# Patient Record
Sex: Female | Born: 1990 | Race: Black or African American | Hispanic: No | Marital: Married | Smoking: Never smoker
Health system: Southern US, Community
[De-identification: ages and names within clinical notes are randomized; demographics above are authoritative.]

## PROBLEM LIST (undated history)

## (undated) DIAGNOSIS — E669 Obesity, unspecified: Secondary | ICD-10-CM

## (undated) DIAGNOSIS — R51 Headache: Secondary | ICD-10-CM

## (undated) DIAGNOSIS — R519 Headache, unspecified: Secondary | ICD-10-CM

## (undated) DIAGNOSIS — G43909 Migraine, unspecified, not intractable, without status migrainosus: Secondary | ICD-10-CM

## (undated) HISTORY — DX: Headache: R51

## (undated) HISTORY — DX: Obesity, unspecified: E66.9

## (undated) HISTORY — PX: CHOLECYSTECTOMY, LAPAROSCOPIC: SHX56

## (undated) HISTORY — DX: Headache, unspecified: R51.9

## (undated) HISTORY — DX: Migraine, unspecified, not intractable, without status migrainosus: G43.909

## (undated) HISTORY — PX: WISDOM TOOTH EXTRACTION: SHX21

---

## 2013-01-06 ENCOUNTER — Emergency Department (INDEPENDENT_AMBULATORY_CARE_PROVIDER_SITE_OTHER)
Admission: EM | Admit: 2013-01-06 | Discharge: 2013-01-06 | Disposition: A | Discharge status: Home / Self Care | Payer: Worker's Compensation | Source: Home / Self Care | Attending: Family Medicine | Admitting: Family Medicine

## 2013-01-06 ENCOUNTER — Encounter (HOSPITAL_COMMUNITY): Payer: Self-pay | Admitting: Emergency Medicine

## 2013-01-06 DIAGNOSIS — B86 Scabies: Secondary | ICD-10-CM

## 2013-01-06 DIAGNOSIS — Z3490 Encounter for supervision of normal pregnancy, unspecified, unspecified trimester: Secondary | ICD-10-CM

## 2013-01-06 LAB — POCT PREGNANCY, URINE: Preg Test, Ur: POSITIVE — AB

## 2013-01-06 MED ORDER — PERMETHRIN 5 % EX CREA: TOPICAL_CREAM | CUTANEOUS | Status: DC

## 2013-01-06 NOTE — ED Notes (Signed)
C/O sporadic itchy spots that appear as bug bites to entire body.  A couple spots have appeared as vesicles, but pt states that's what her skin does when she gets bug bites.  Family members do not have same rash.  Has tried hydrocortisone cream today.

## 2013-01-06 NOTE — ED Provider Notes (Signed)
Hannah Morton is a 22 y.o. female who presents to Urgent Care today for rash. 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 She notes the rash is quite itchy especially at night. She has not tried any medications.  Additionally she notes irregular menstrual periods her last period was about 6 weeks ago. She is worried that she may be pregnant.   History reviewed. No pertinent past medical history. History  Substance Use Topics  . Smoking status: Never Smoker   . Smokeless tobacco: Not on file  . Alcohol Use: No   ROS as above Medications reviewed. No current facility-administered medications for this encounter.   Current Outpatient Prescriptions  Medication Sig Dispense Refill  . permethrin (ELIMITE) 5 % cream Apply from the neck down at night and wash off in the morning once  180 g  1    Exam:  BP 133/75  Pulse 83  Temp(Src) 98.2 F (36.8 C) (Oral)  Resp 16  SpO2 100%  LMP 11/26/2012 Gen: Well NAD SKIN: Erythematous papules scattered across body and in one interdigital webspace. Most are excoriated.  Results for orders placed during the hospital encounter of 01/06/13 (from the past 24 hour(s))  POCT PREGNANCY, URINE     Status: Abnormal   Collection Time    01/06/13 12:50 PM      Result Value Range   Preg Test, Ur POSITIVE (*) NEGATIVE   No results found.  Assessment and Plan: 22 y.o. female with  1) scabies: Plan to treat with permethrin cream, Benadryl, and Gold Bond Itch. (Permethrin as pregnancy category B.) Followup as needed.  2) pregnancy: Unexpected unplanned pregnancy. Start prenatal vitamins. Refer to [facility]N or [facility].  Discussed warning signs or symptoms. Please see discharge instructions. Patient expresses understanding.     Rodolph Bong,  MD 01/06/13 [phone]

## 2013-02-12 LAB — OB RESULTS CONSOLE RUBELLA ANTIBODY, IGM: RUBELLA: IMMUNE

## 2013-02-12 LAB — OB RESULTS CONSOLE HIV ANTIBODY (ROUTINE TESTING): HIV: NONREACTIVE

## 2013-02-12 LAB — OB RESULTS CONSOLE HEPATITIS B SURFACE ANTIGEN: Hepatitis B Surface Ag: NEGATIVE

## 2013-02-12 LAB — OB RESULTS CONSOLE ABO/RH: RH Type: POSITIVE

## 2013-02-12 LAB — OB RESULTS CONSOLE ANTIBODY SCREEN: ANTIBODY SCREEN: NEGATIVE

## 2013-02-12 LAB — OB RESULTS CONSOLE RPR: RPR: NONREACTIVE

## 2013-03-14 NOTE — L&D Delivery Note (Signed)
Delivery Note At 11:09 AM a viable and healthy female was delivered via Vaginal, Spontaneous Delivery (Presentation: Left Occiput; Anterior ).  APGAR:9 ,9 ; weight pending.   Placenta status: spontaneous, intact.  Cord:  3V   Anesthesia: Epidural  Episiotomy: None Lacerations: None Suture Repair: NA Est. Blood Loss (mL): 250cc  Mom to postpartum.  Baby to Couplet care / Skin to Skin.  ROSS,KENDRA H. 09/04/2013, 11:20 AM

## 2013-08-03 LAB — OB RESULTS CONSOLE GBS: GBS: NEGATIVE

## 2013-08-06 LAB — OB RESULTS CONSOLE GC/CHLAMYDIA
CHLAMYDIA, DNA PROBE: NEGATIVE
Gonorrhea: NEGATIVE

## 2013-09-04 ENCOUNTER — Inpatient Hospital Stay (HOSPITAL_COMMUNITY)
Admission: AD | Admit: 2013-09-04 | Discharge: 2013-09-06 | DRG: 775 | Disposition: A | Discharge status: Home / Self Care | Payer: 59 | Source: Ambulatory Visit | Attending: Obstetrics and Gynecology | Admitting: Obstetrics and Gynecology

## 2013-09-04 ENCOUNTER — Inpatient Hospital Stay (HOSPITAL_COMMUNITY): Payer: 59 | Admitting: Anesthesiology

## 2013-09-04 ENCOUNTER — Encounter (HOSPITAL_COMMUNITY): Payer: Self-pay

## 2013-09-04 ENCOUNTER — Encounter (HOSPITAL_COMMUNITY): Payer: 59 | Admitting: Anesthesiology

## 2013-09-04 DIAGNOSIS — Z885 Allergy status to narcotic agent status: Secondary | ICD-10-CM

## 2013-09-04 DIAGNOSIS — Z87891 Personal history of nicotine dependence: Secondary | ICD-10-CM

## 2013-09-04 DIAGNOSIS — IMO0001 Reserved for inherently not codable concepts without codable children: Secondary | ICD-10-CM

## 2013-09-04 DIAGNOSIS — O4290 Premature rupture of membranes, unspecified as to length of time between rupture and onset of labor, unspecified weeks of gestation: Secondary | ICD-10-CM

## 2013-09-04 DIAGNOSIS — O99891 Other specified diseases and conditions complicating pregnancy: Secondary | ICD-10-CM | POA: Diagnosis present

## 2013-09-04 LAB — CBC
HCT: 35 % — ABNORMAL LOW (ref 36.0–46.0)
HEMOGLOBIN: 11.9 g/dL — AB (ref 12.0–15.0)
MCH: 28 pg (ref 26.0–34.0)
MCHC: 34 g/dL (ref 30.0–36.0)
MCV: 82.4 fL (ref 78.0–100.0)
Platelets: 216 10*3/uL (ref 150–400)
RBC: 4.25 MIL/uL (ref 3.87–5.11)
RDW: 14.2 % (ref 11.5–15.5)
WBC: 12.6 10*3/uL — ABNORMAL HIGH (ref 4.0–10.5)

## 2013-09-04 LAB — ABO/RH: ABO/RH(D): A POS

## 2013-09-04 LAB — TYPE AND SCREEN
ABO/RH(D): A POS
Antibody Screen: NEGATIVE

## 2013-09-04 LAB — RPR

## 2013-09-04 LAB — POCT FERN TEST: POCT Fern Test: POSITIVE

## 2013-09-04 MED ORDER — PRENATAL MULTIVITAMIN CH
1.0000 | ORAL_TABLET | Freq: Every day | ORAL | Status: DC
  Filled 2013-09-04: qty 1

## 2013-09-04 MED ORDER — LIDOCAINE HCL (PF) 1 % IJ SOLN
30.0000 mL | INTRAMUSCULAR | Status: DC | PRN
  Filled 2013-09-04: qty 30

## 2013-09-04 MED ORDER — ONDANSETRON HCL 4 MG/2ML IJ SOLN
4.0000 mg | INTRAMUSCULAR | Status: DC | PRN
Start: 2013-09-04 — End: 2013-09-06

## 2013-09-04 MED ORDER — OXYTOCIN 40 UNITS IN LACTATED RINGERS INFUSION - SIMPLE MED
1.0000 m[IU]/min | INTRAVENOUS | Status: DC
Start: 2013-09-04 — End: 2013-09-04
  Administered 2013-09-04: 2 m[IU]/min via INTRAVENOUS

## 2013-09-04 MED ORDER — OXYTOCIN 40 UNITS IN LACTATED RINGERS INFUSION - SIMPLE MED
62.5000 mL/h | INTRAVENOUS | Status: DC
  Filled 2013-09-04: qty 1000

## 2013-09-04 MED ORDER — PHENYLEPHRINE 40 MCG/ML (10ML) SYRINGE FOR IV PUSH (FOR BLOOD PRESSURE SUPPORT)
80.0000 ug | PREFILLED_SYRINGE | INTRAVENOUS | Status: DC | PRN
  Filled 2013-09-04: qty 10
  Filled 2013-09-04: qty 2

## 2013-09-04 MED ORDER — DIPHENHYDRAMINE HCL 25 MG PO CAPS: 25.0000 mg | ORAL_CAPSULE | Freq: Four times a day (QID) | ORAL | Status: DC | PRN

## 2013-09-04 MED ORDER — LIDOCAINE HCL (PF) 1 % IJ SOLN
INTRAMUSCULAR | Status: DC | PRN
  Administered 2013-09-04 (×4): 4 mL

## 2013-09-04 MED ORDER — IBUPROFEN 600 MG PO TABS: 600.0000 mg | ORAL_TABLET | Freq: Four times a day (QID) | ORAL | Status: DC | PRN

## 2013-09-04 MED ORDER — METHYLERGONOVINE MALEATE 0.2 MG/ML IJ SOLN: 0.2000 mg | INTRAMUSCULAR | Status: DC | PRN

## 2013-09-04 MED ORDER — METHYLERGONOVINE MALEATE 0.2 MG PO TABS: 0.2000 mg | ORAL_TABLET | ORAL | Status: DC | PRN

## 2013-09-04 MED ORDER — DIBUCAINE 1 % RE OINT: 1.0000 "application " | TOPICAL_OINTMENT | RECTAL | Status: DC | PRN

## 2013-09-04 MED ORDER — FENTANYL 2.5 MCG/ML BUPIVACAINE 1/10 % EPIDURAL INFUSION (WH - ANES)
14.0000 mL/h | INTRAMUSCULAR | Status: DC | PRN
  Administered 2013-09-04: 14 mL/h via EPIDURAL
  Filled 2013-09-04: qty 125

## 2013-09-04 MED ORDER — LACTATED RINGERS IV SOLN: 500.0000 mL | INTRAVENOUS | Status: DC | PRN

## 2013-09-04 MED ORDER — BUTORPHANOL TARTRATE 1 MG/ML IJ SOLN
1.0000 mg | INTRAMUSCULAR | Status: DC | PRN
  Administered 2013-09-04: 1 mg via INTRAVENOUS
  Filled 2013-09-04: qty 1

## 2013-09-04 MED ORDER — ONDANSETRON HCL 4 MG/2ML IJ SOLN: 4.0000 mg | Freq: Four times a day (QID) | INTRAMUSCULAR | Status: DC | PRN

## 2013-09-04 MED ORDER — LACTATED RINGERS IV SOLN
INTRAVENOUS | Status: DC
  Administered 2013-09-04: 125 mL/h via INTRAVENOUS

## 2013-09-04 MED ORDER — WITCH HAZEL-GLYCERIN EX PADS: 1.0000 "application " | MEDICATED_PAD | CUTANEOUS | Status: DC | PRN

## 2013-09-04 MED ORDER — TETANUS-DIPHTH-ACELL PERTUSSIS 5-2.5-18.5 LF-MCG/0.5 IM SUSP
0.5000 mL | Freq: Once | INTRAMUSCULAR | Status: AC
  Administered 2013-09-05: 0.5 mL via INTRAMUSCULAR
  Filled 2013-09-04: qty 0.5

## 2013-09-04 MED ORDER — ZOLPIDEM TARTRATE 5 MG PO TABS: 5.0000 mg | ORAL_TABLET | Freq: Every evening | ORAL | Status: DC | PRN

## 2013-09-04 MED ORDER — PHENYLEPHRINE 40 MCG/ML (10ML) SYRINGE FOR IV PUSH (FOR BLOOD PRESSURE SUPPORT)
80.0000 ug | PREFILLED_SYRINGE | INTRAVENOUS | Status: DC | PRN
  Filled 2013-09-04: qty 2

## 2013-09-04 MED ORDER — SENNOSIDES-DOCUSATE SODIUM 8.6-50 MG PO TABS
2.0000 | ORAL_TABLET | ORAL | Status: DC
  Administered 2013-09-05 (×2): 2 via ORAL
  Filled 2013-09-04 (×2): qty 2

## 2013-09-04 MED ORDER — SIMETHICONE 80 MG PO CHEW
80.0000 mg | CHEWABLE_TABLET | ORAL | Status: DC | PRN
  Administered 2013-09-05 (×2): 80 mg via ORAL
  Filled 2013-09-04: qty 1

## 2013-09-04 MED ORDER — EPHEDRINE 5 MG/ML INJ
10.0000 mg | INTRAVENOUS | Status: DC | PRN
  Filled 2013-09-04: qty 2

## 2013-09-04 MED ORDER — ONDANSETRON HCL 4 MG PO TABS: 4.0000 mg | ORAL_TABLET | ORAL | Status: DC | PRN

## 2013-09-04 MED ORDER — LANOLIN HYDROUS EX OINT: TOPICAL_OINTMENT | CUTANEOUS | Status: DC | PRN

## 2013-09-04 MED ORDER — CITRIC ACID-SODIUM CITRATE 334-500 MG/5ML PO SOLN: 30.0000 mL | ORAL | Status: DC | PRN

## 2013-09-04 MED ORDER — TERBUTALINE SULFATE 1 MG/ML IJ SOLN
0.2500 mg | Freq: Once | INTRAMUSCULAR | Status: DC | PRN
Start: 2013-09-04 — End: 2013-09-04

## 2013-09-04 MED ORDER — OXYCODONE-ACETAMINOPHEN 5-325 MG PO TABS
1.0000 | ORAL_TABLET | ORAL | Status: DC | PRN
  Administered 2013-09-04: 1 via ORAL
  Filled 2013-09-04: qty 1

## 2013-09-04 MED ORDER — IBUPROFEN 600 MG PO TABS
600.0000 mg | ORAL_TABLET | Freq: Four times a day (QID) | ORAL | Status: DC
  Administered 2013-09-04 – 2013-09-06 (×7): 600 mg via ORAL
  Filled 2013-09-04 (×7): qty 1

## 2013-09-04 MED ORDER — OXYTOCIN BOLUS FROM INFUSION: 500.0000 mL | INTRAVENOUS | Status: DC

## 2013-09-04 MED ORDER — OXYCODONE-ACETAMINOPHEN 5-325 MG PO TABS: 1.0000 | ORAL_TABLET | ORAL | Status: DC | PRN

## 2013-09-04 MED ORDER — FLEET ENEMA 7-19 GM/118ML RE ENEM: 1.0000 | ENEMA | RECTAL | Status: DC | PRN

## 2013-09-04 MED ORDER — DIPHENHYDRAMINE HCL 50 MG/ML IJ SOLN: 12.5000 mg | INTRAMUSCULAR | Status: DC | PRN

## 2013-09-04 MED ORDER — EPHEDRINE 5 MG/ML INJ
10.0000 mg | INTRAVENOUS | Status: DC | PRN
Start: 2013-09-04 — End: 2013-09-04
  Filled 2013-09-04: qty 2
  Filled 2013-09-04: qty 4

## 2013-09-04 MED ORDER — LACTATED RINGERS IV SOLN: 500.0000 mL | Freq: Once | INTRAVENOUS | Status: DC

## 2013-09-04 MED ORDER — BENZOCAINE-MENTHOL 20-0.5 % EX AERO: 1.0000 "application " | INHALATION_SPRAY | CUTANEOUS | Status: DC | PRN

## 2013-09-04 MED ORDER — ACETAMINOPHEN 325 MG PO TABS: 650.0000 mg | ORAL_TABLET | ORAL | Status: DC | PRN

## 2013-09-04 NOTE — Anesthesia Procedure Notes (Signed)
Epidural Patient location during procedure: OB Start time: 09/04/2013 8:02 AM  Staffing Performed by: anesthesiologist   Preanesthetic Checklist Completed: patient identified, site marked, surgical consent, pre-op evaluation, timeout performed, IV checked, risks and benefits discussed and monitors and equipment checked  Epidural Patient position: sitting Prep: site prepped and draped and DuraPrep Patient monitoring: continuous pulse ox and blood pressure Approach: midline Injection technique: LOR air  Needle:  Needle type: Tuohy  Needle gauge: 17 G Needle length: 9 cm and 9 Needle insertion depth: 5 cm cm Catheter type: closed end flexible Catheter size: 19 Gauge Catheter at skin depth: 10 cm Test dose: negative  Assessment Events: blood not aspirated, injection not painful, no injection resistance, negative IV test and no paresthesia  Additional Notes Discussed risk of headache, infection, bleeding, nerve injury and failed or incomplete block.  Patient voices understanding and wishes to proceed.  Epidural placed easily on first attempt.  No paresthesia.  Patient tolerated procedure well with no apparent complications.  A.Cassidy, MDReason for block:procedure for pain

## 2013-09-04 NOTE — MAU Provider Note (Signed)
  First Provider Initiated Contact with Patient 09/04/13 [phone]     HPI: Hannah Morton is a 23 y.o. year old [id number] female at 3367w2d weeks gestation who presents to MAU reporting Spontaneous rupture of membranes at 2:30 AM and mild/moderate contractions.  PELVIC EXAM: Normal external female genitalia, positive pooling. No blood. Dilation: 3 Effacement (%): 50 Cervical Position: Posterior Station: -3 Presentation: Vertex Exam by:: Hannah, CNM  Positive fern.  EFM: 140s, moderate variability, 15 x 15 accelerations, no decelerations Fetal heart rate category 1. Toco: Q. 3-6, mild-moderate  ASSESSMENT: SROM Early labor Fetal heart rate category 1  PLAN: Admit to birthing suites Dr. Henderson Morton to assume care of patient.  Hannah Morton, [place] 09/04/2013 6:39 AM

## 2013-09-04 NOTE — Progress Notes (Signed)
Ur chart review complete per request.

## 2013-09-04 NOTE — MAU Note (Signed)
PT SAYS  SHE HAS BEEN HAVING UC  BUT  THINKS SROM  AT 0230-WAS SITTING  THEN ASLEEP AND  WHEN AWOKE- HAD GUSH OF FLUID.    DENIES HSV AND MRSA.  VE IN OFFICE ON Friday-   3 CM.  GBS-  NEG

## 2013-09-04 NOTE — Progress Notes (Signed)
Dr Tenny Crawoss called at pt's request about pt's allergy to codeine.  Patient has itching when taking codeine.  No new orders for meds given

## 2013-09-04 NOTE — Anesthesia Preprocedure Evaluation (Signed)
Anesthesia Evaluation  Patient identified by MRN, date of birth, ID band Patient awake    Reviewed: Allergy & Precautions, H&P , NPO status , Patient's Chart, lab work & pertinent test results, reviewed documented beta blocker date and time   History of Anesthesia Complications Negative for: history of anesthetic complications  Airway Mallampati: II TM Distance: >3 FB Neck ROM: full    Dental  (+) Teeth Intact   Pulmonary neg pulmonary ROS, former smoker,  breath sounds clear to auscultation        Cardiovascular negative cardio ROS  Rhythm:regular Rate:Normal     Neuro/Psych negative neurological ROS  negative psych ROS   GI/Hepatic negative GI ROS, Neg liver ROS,   Endo/Other  BMI 38  Renal/GU negative Renal ROS     Musculoskeletal   Abdominal   Peds  Hematology negative hematology ROS (+)   Anesthesia Other Findings   Reproductive/Obstetrics (+) Pregnancy                           Anesthesia Physical Anesthesia Plan  ASA: II  Anesthesia Plan: Epidural   Post-op Pain Management:    Induction:   Airway Management Planned:   Additional Equipment:   Intra-op Plan:   Post-operative Plan:   Informed Consent: I have reviewed the patients History and Physical, chart, labs and discussed the procedure including the risks, benefits and alternatives for the proposed anesthesia with the patient or authorized representative who has indicated his/her understanding and acceptance.     Plan Discussed with:   Anesthesia Plan Comments:         Anesthesia Quick Evaluation

## 2013-09-04 NOTE — Anesthesia Postprocedure Evaluation (Signed)
Anesthesia Post Note  Patient: Hannah PerchesBrittany Farace  Procedure(s) Performed: * No procedures listed *  Anesthesia type: Epidural  Patient location: Mother/Baby  Post pain: Pain level controlled  Post assessment: Post-op Vital signs reviewed  Last Vitals:  Filed Vitals:   09/04/13 1420  BP: 116/77  Pulse: 98  Temp: 36.7 C  Resp: 20    Post vital signs: Reviewed  Level of consciousness:alert  Complications: No apparent anesthesia complications

## 2013-09-05 LAB — CBC
HCT: 33.4 % — ABNORMAL LOW (ref 36.0–46.0)
Hemoglobin: 11.4 g/dL — ABNORMAL LOW (ref 12.0–15.0)
MCH: 28.1 pg (ref 26.0–34.0)
MCHC: 34.1 g/dL (ref 30.0–36.0)
MCV: 82.3 fL (ref 78.0–100.0)
PLATELETS: 214 10*3/uL (ref 150–400)
RBC: 4.06 MIL/uL (ref 3.87–5.11)
RDW: 14.3 % (ref 11.5–15.5)
WBC: 15.1 10*3/uL — ABNORMAL HIGH (ref 4.0–10.5)

## 2013-09-05 NOTE — Progress Notes (Signed)
PPD#1 Pt is doing well. Will have circ in office. VSSAF IMP/ doing well PLAN/ Routine care.

## 2013-09-06 NOTE — Discharge Summary (Signed)
Obstetric Discharge Summary Reason for Admission: onset of labor Prenatal Procedures: none Intrapartum Procedures: spontaneous vaginal delivery Postpartum Procedures: none Complications-Operative and Postpartum: none Hemoglobin  Date Value Ref Range Status  09/05/2013 11.4* 12.0 - 15.0 g/dL Final     HCT  Date Value Ref Range Status  09/05/2013 33.4* 36.0 - 46.0 % Final    Discharge Diagnoses: Term Pregnancy-delivered  Discharge Information: Date: 09/06/2013 Activity: pelvic rest Diet: routine Medications: Ibuprofen Condition: stable Instructions: refer to practice specific booklet Discharge to: home Follow-up Information   Follow up with Almon HerculesOSS,KENDRA H., MD In 4 weeks.   Specialty:  Obstetrics and Gynecology   Contact information:   [address] [place] [address] [phone]       Newborn Data: Live born female  Birth Weight: 5 lb 6.8 oz (2461 g) APGAR: 8, 9  Home with mother.  HORVATH,MICHELLE A 09/06/2013, 7:59 AM

## 2013-09-06 NOTE — Progress Notes (Signed)
Patient is eating, ambulating, voiding.  Pain control is good.  Filed Vitals:   09/05/13 0200 09/05/13 0545 09/05/13 1815 09/06/13 0606  BP: 120/69 108/72 110/78 119/78  Pulse: 81 73 74 82  Temp: 98.3 F (36.8 C) 98.1 F (36.7 C) 98 F (36.7 C) 98.6 F (37 C)  TempSrc: Oral Oral Oral Oral  Resp: 20 18 20 18   Height:      Weight:      SpO2:        Fundus firm Perineum without swelling.  Lab Results  Component Value Date   WBC 15.1* 09/05/2013   HGB 11.4* 09/05/2013   HCT 33.4* 09/05/2013   MCV 82.3 09/05/2013   PLT 214 09/05/2013    --/--/A POS, A POS (06/24 0423)/RI  A/P Post partum day 2.  Routine care.  Expect d/c today.    HORVATH,MICHELLE A

## 2013-09-06 NOTE — Lactation Note (Signed)
This note was copied from the chart of Hannah BurundiBrittany Winslow. Lactation Consultation Note First time breast feeding. Has good breast anatomy. C/o soreness to Lt. Nipple noted slight bruise to center. CG given. Explained about depth and position options, signs of good latch. Mom encouraged to feed baby 8-12 times/24 hours and with feeding cues. Specifics of an asymmetric latch shown. Reviewed Baby & Me book's Breastfeeding Basics. Mom reports + breast changes w/pregnancy. Mom encouraged to feed baby w/feeding cuesHand expression taught to Mom. WH/LC brochure given w/resources, support groups and LC services.Referred to Baby and Me Book in Breastfeeding section Pg. 22-23 for position options and Proper latch demonstration.Encouraged comfort during BF so colostrum flows better and mom will enjoy the feeding longer. Taking deep breaths and breast massage during BF. Mom encouraged to waken baby for feeds.  Patient Name: Hannah Tora PerchesBrittany Morton Today's Date: 09/06/2013     Maternal Data    Feeding Feeding Type: Breast Fed Length of feed: 5 min  LATCH Score/Interventions Latch: Repeated attempts needed to sustain latch, nipple held in mouth throughout feeding, stimulation needed to elicit sucking reflex.  Audible Swallowing: Spontaneous and intermittent Intervention(s): Hand expression  Type of Nipple: Everted at rest and after stimulation  Comfort (Breast/Nipple): Soft / non-tender     Hold (Positioning): No assistance needed to correctly position infant at breast.  LATCH Score: 9  Lactation Tools Discussed/Used     Consult Status      Charyl DancerCARVER, Emett Stapel G 09/06/2013, 6:36 AM

## 2014-01-13 ENCOUNTER — Encounter (HOSPITAL_COMMUNITY): Payer: Self-pay

## 2014-03-03 ENCOUNTER — Emergency Department (HOSPITAL_COMMUNITY)
Admission: EM | Admit: 2014-03-03 | Discharge: 2014-03-03 | Disposition: A | Discharge status: Home / Self Care | Payer: 59 | Attending: Emergency Medicine | Admitting: Emergency Medicine

## 2014-03-03 ENCOUNTER — Encounter (HOSPITAL_COMMUNITY): Payer: Self-pay | Admitting: Family Medicine

## 2014-03-03 DIAGNOSIS — Z79899 Other long term (current) drug therapy: Secondary | ICD-10-CM | POA: Diagnosis not present

## 2014-03-03 DIAGNOSIS — Z87891 Personal history of nicotine dependence: Secondary | ICD-10-CM | POA: Diagnosis not present

## 2014-03-03 DIAGNOSIS — R112 Nausea with vomiting, unspecified: Secondary | ICD-10-CM | POA: Diagnosis not present

## 2014-03-03 DIAGNOSIS — Z3202 Encounter for pregnancy test, result negative: Secondary | ICD-10-CM | POA: Insufficient documentation

## 2014-03-03 DIAGNOSIS — R109 Unspecified abdominal pain: Secondary | ICD-10-CM | POA: Diagnosis not present

## 2014-03-03 LAB — CBC WITH DIFFERENTIAL/PLATELET
Basophils Absolute: 0 10*3/uL (ref 0.0–0.1)
Basophils Relative: 0 % (ref 0–1)
Eosinophils Absolute: 0 10*3/uL (ref 0.0–0.7)
Eosinophils Relative: 0 % (ref 0–5)
HEMATOCRIT: 42.3 % (ref 36.0–46.0)
HEMOGLOBIN: 13.8 g/dL (ref 12.0–15.0)
LYMPHS ABS: 1.2 10*3/uL (ref 0.7–4.0)
LYMPHS PCT: 6 % — AB (ref 12–46)
MCH: 26.8 pg (ref 26.0–34.0)
MCHC: 32.6 g/dL (ref 30.0–36.0)
MCV: 82.1 fL (ref 78.0–100.0)
MONO ABS: 0.6 10*3/uL (ref 0.1–1.0)
MONOS PCT: 3 % (ref 3–12)
NEUTROS ABS: 17.2 10*3/uL — AB (ref 1.7–7.7)
Neutrophils Relative %: 91 % — ABNORMAL HIGH (ref 43–77)
Platelets: 144 10*3/uL — ABNORMAL LOW (ref 150–400)
RBC: 5.15 MIL/uL — AB (ref 3.87–5.11)
RDW: 13.6 % (ref 11.5–15.5)
WBC: 19 10*3/uL — AB (ref 4.0–10.5)

## 2014-03-03 LAB — COMPREHENSIVE METABOLIC PANEL
ALT: 10 U/L (ref 0–35)
AST: 15 U/L (ref 0–37)
Albumin: 3.8 g/dL (ref 3.5–5.2)
Alkaline Phosphatase: 74 U/L (ref 39–117)
Anion gap: 15 (ref 5–15)
BILIRUBIN TOTAL: 0.6 mg/dL (ref 0.3–1.2)
BUN: 16 mg/dL (ref 6–23)
CHLORIDE: 104 meq/L (ref 96–112)
CO2: 20 meq/L (ref 19–32)
CREATININE: 0.48 mg/dL — AB (ref 0.50–1.10)
Calcium: 8.9 mg/dL (ref 8.4–10.5)
GFR calc Af Amer: >90 mL/min (ref >90)
Glucose, Bld: 89 mg/dL (ref 70–99)
Potassium: 4 mEq/L (ref 3.7–5.3)
Sodium: 139 mEq/L (ref 137–147)
Total Protein: 7.5 g/dL (ref 6.0–8.3)

## 2014-03-03 LAB — URINALYSIS, ROUTINE W REFLEX MICROSCOPIC
BILIRUBIN URINE: NEGATIVE
Glucose, UA: NEGATIVE mg/dL
Ketones, ur: 15 mg/dL — AB
Leukocytes, UA: NEGATIVE
Nitrite: NEGATIVE
PH: 6 (ref 5.0–8.0)
Protein, ur: NEGATIVE mg/dL
Specific Gravity, Urine: 1.028 (ref 1.005–1.030)
Urobilinogen, UA: 1 mg/dL (ref 0.0–1.0)

## 2014-03-03 LAB — URINE MICROSCOPIC-ADD ON

## 2014-03-03 LAB — LIPASE, BLOOD: Lipase: 19 U/L (ref 11–59)

## 2014-03-03 LAB — PREGNANCY, URINE: Preg Test, Ur: NEGATIVE

## 2014-03-03 LAB — POC URINE PREG, ED: Preg Test, Ur: NEGATIVE

## 2014-03-03 MED ORDER — ONDANSETRON 4 MG PO TBDP
4.0000 mg | ORAL_TABLET | Freq: Once | ORAL | Status: AC
  Administered 2014-03-03: 4 mg via ORAL
  Filled 2014-03-03: qty 1

## 2014-03-03 MED ORDER — DICYCLOMINE HCL 10 MG PO CAPS
20.0000 mg | ORAL_CAPSULE | Freq: Once | ORAL | Status: AC
  Administered 2014-03-03: 20 mg via ORAL
  Filled 2014-03-03: qty 2

## 2014-03-03 MED ORDER — ONDANSETRON 4 MG PO TBDP: 4.0000 mg | ORAL_TABLET | Freq: Three times a day (TID) | ORAL | Status: DC | PRN

## 2014-03-03 MED ORDER — DICYCLOMINE HCL 20 MG PO TABS: 20.0000 mg | ORAL_TABLET | Freq: Two times a day (BID) | ORAL | Status: DC

## 2014-03-03 NOTE — ED Notes (Signed)
POCT preg negative per lab

## 2014-03-03 NOTE — Discharge Instructions (Signed)
Take the prescribed medication as directed.  Continue drinking fluids to keep yourself hydrated. Follow-up with your primary care physician. Return to the ED for new or worsening symptoms.

## 2014-03-03 NOTE — ED Notes (Signed)
Per pt she woke up this am vomiting bile and blood. sts abd cramping.

## 2014-03-03 NOTE — ED Notes (Signed)
Gave pt a Sprite. Pt tolerating well. Denies n/v

## 2014-03-03 NOTE — ED Provider Notes (Signed)
CSN: [id number]     Arrival date & time 03/03/14  1353 History   First MD Initiated Contact with Patient 03/03/14 1511     Chief Complaint  Patient presents with  . Emesis     (Consider location/radiation/quality/duration/timing/severity/associated sxs/prior Treatment) Patient is a 23 y.o. female presenting with vomiting. The history is provided by the patient and medical records.  Emesis Associated symptoms: abdominal pain    This is a 23 year old female with no significant past medical history presenting to the ED for nausea, vomiting, and abdominal cramping beginning this morning waking. States last night she and her husband both ate chicken wings-- he is currently asymptomatic. She denies any recent travel. No reported fever or chills. States last emesis was around noon. She states during last emesis she did have a few small streaks of blood in her vomit. She has not attempted to eat or drink since symptoms began.  States abdominal cramping has improved, still rated at a mild discomfort.  No prior abdominal surgeries.  No urinary sx or vaginal complaints.  Denies possibility of pregnancy.  VS stable on arrival.   History reviewed. No pertinent past medical history. History reviewed. No pertinent past surgical history. History reviewed. No pertinent family history. History  Substance Use Topics  . Smoking status: Former Games developermoker  . Smokeless tobacco: Not on file  . Alcohol Use: No   OB History    Gravida Para Term Preterm AB TAB SAB Ectopic Multiple Living   3 2 2  1 1    2      Review of Systems  Gastrointestinal: Positive for nausea, vomiting and abdominal pain.  All other systems reviewed and are negative.     Allergies  Codeine  Home Medications   Prior to Admission medications   Medication Sig Start Date End Date Taking? Authorizing Provider  etonogestrel (NEXPLANON) 68 MG IMPL implant 1 each by Subdermal route once.   Yes Historical Provider, MD  permethrin  (ELIMITE) 5 % cream Apply from the neck down at night and wash off in the morning once Patient not taking: Reported on 03/03/2014 01/06/13   Rodolph BongEvan S Corey, MD   BP 131/83 mmHg  Pulse 89  Temp(Src) 98.1 F (36.7 C) (Oral)  Resp 18  SpO2 99%   Physical Exam  Constitutional: She is oriented to person, place, and time. She appears well-developed and well-nourished.  HENT:  Head: Normocephalic and atraumatic.  Mouth/Throat: Oropharynx is clear and moist.  Moist mucous membranes  Eyes: Conjunctivae and EOM are normal. Pupils are equal, round, and reactive to light.  Neck: Normal range of motion.  Cardiovascular: Normal rate, regular rhythm and normal heart sounds.   Pulmonary/Chest: Effort normal and breath sounds normal.  Abdominal: Soft. Bowel sounds are normal. There is no tenderness. There is no rigidity, no guarding and no CVA tenderness.  Abdomen soft, nondistended, no focal tenderness or peritoneal signs  Musculoskeletal: Normal range of motion.  Neurological: She is alert and oriented to person, place, and time.  Skin: Skin is warm and dry.  Psychiatric: She has a normal mood and affect.  Nursing note and vitals reviewed.   ED Course  Procedures (including critical care time) Labs Review Labs Reviewed  CBC WITH DIFFERENTIAL - Abnormal; Notable for the following:    WBC 19.0 (*)    RBC 5.15 (*)    Platelets 144 (*)    Neutrophils Relative % 91 (*)    Neutro Abs 17.2 (*)    Lymphocytes Relative  6 (*)    All other components within normal limits  COMPREHENSIVE METABOLIC PANEL - Abnormal; Notable for the following:    Creatinine, Ser 0.48 (*)    All other components within normal limits  URINALYSIS, ROUTINE W REFLEX MICROSCOPIC - Abnormal; Notable for the following:    APPearance HAZY (*)    Hgb urine dipstick MODERATE (*)    Ketones, ur 15 (*)    All other components within normal limits  URINE MICROSCOPIC-ADD ON - Abnormal; Notable for the following:    Squamous  Epithelial / LPF MANY (*)    Bacteria, UA FEW (*)    All other components within normal limits  LIPASE, BLOOD  PREGNANCY, URINE  POC URINE PREG, ED    Imaging Review No results found.   EKG Interpretation None      MDM   Final diagnoses:  Abdominal pain, unspecified abdominal location  Nausea and vomiting, vomiting of unspecified type   23 year old female with sudden onset nausea, vomiting, and abdominal cramping is morning upon waking. Last by PO intake was chicken wings last night.  On exam, patient afebrile and non-toxic in appearance.  Abdominal exam is benign.  Mucous membranes remain moist, does not appear dehydrated.  Will obtain basic labs, PO zofran and bentyl given.  Labwork as above. Leukocytosis of 19,000--nonspecific, possibly acute phase reaction from vomiting. On repeat evaluation her abdominal exam remains benign. She states significant improvement of symptoms after by mouth Zofran and Bentyl. She has tolerated PO fluids without difficulty, no active vomiting in the ED. At this time low suspicion for acute or surgical abdomen. Patient will be discharged home with symptomatic control.  Discussed plan with patient, he/she acknowledged understanding and agreed with plan of care.  Return precautions given for new or worsening symptoms.  Garlon HatchetLisa M Sanders, PA-C 03/03/14 1817  Warnell Foresterrey Wofford, MD 03/04/14 1311

## 2015-07-13 DIAGNOSIS — Z6841 Body Mass Index (BMI) 40.0 and over, adult: Secondary | ICD-10-CM | POA: Diagnosis not present

## 2015-07-13 DIAGNOSIS — G43009 Migraine without aura, not intractable, without status migrainosus: Secondary | ICD-10-CM | POA: Diagnosis not present

## 2015-07-13 DIAGNOSIS — R51 Headache: Secondary | ICD-10-CM | POA: Diagnosis not present

## 2015-07-13 DIAGNOSIS — Z3046 Encounter for surveillance of implantable subdermal contraceptive: Secondary | ICD-10-CM | POA: Diagnosis not present

## 2015-07-20 DIAGNOSIS — Z5181 Encounter for therapeutic drug level monitoring: Secondary | ICD-10-CM | POA: Diagnosis not present

## 2015-07-21 ENCOUNTER — Other Ambulatory Visit: Payer: Self-pay | Admitting: Family Medicine

## 2015-07-21 ENCOUNTER — Ambulatory Visit
Admission: RE | Admit: 2015-07-21 | Discharge: 2015-07-21 | Disposition: A | Discharge status: Home / Self Care | Payer: 59 | Source: Ambulatory Visit | Attending: Family Medicine | Admitting: Family Medicine

## 2015-07-21 DIAGNOSIS — Z8249 Family history of ischemic heart disease and other diseases of the circulatory system: Secondary | ICD-10-CM

## 2015-07-21 DIAGNOSIS — R51 Headache: Principal | ICD-10-CM

## 2015-07-21 DIAGNOSIS — R519 Headache, unspecified: Secondary | ICD-10-CM

## 2015-07-21 MED ORDER — IOPAMIDOL (ISOVUE-300) INJECTION 61%
75.0000 mL | Freq: Once | INTRAVENOUS | Status: AC | PRN
  Administered 2015-07-21: 75 mL via INTRAVENOUS

## 2015-07-31 MED FILL — SUMATRIPTAN SUCC 50 MG TAB: 50 | 30 days supply | Qty: 9 | Fill #0

## 2015-08-13 ENCOUNTER — Encounter (HOSPITAL_COMMUNITY): Payer: Self-pay | Admitting: *Deleted

## 2015-08-13 ENCOUNTER — Emergency Department (HOSPITAL_COMMUNITY)
Admission: EM | Admit: 2015-08-13 | Discharge: 2015-08-13 | Disposition: A | Discharge status: Home / Self Care | Payer: 59 | Attending: Emergency Medicine | Admitting: Emergency Medicine

## 2015-08-13 DIAGNOSIS — M545 Low back pain: Secondary | ICD-10-CM | POA: Diagnosis not present

## 2015-08-13 DIAGNOSIS — S134XXA Sprain of ligaments of cervical spine, initial encounter: Secondary | ICD-10-CM | POA: Diagnosis not present

## 2015-08-13 DIAGNOSIS — Y939 Activity, unspecified: Secondary | ICD-10-CM | POA: Diagnosis not present

## 2015-08-13 DIAGNOSIS — Z87891 Personal history of nicotine dependence: Secondary | ICD-10-CM | POA: Diagnosis not present

## 2015-08-13 DIAGNOSIS — T148 Other injury of unspecified body region: Secondary | ICD-10-CM | POA: Diagnosis not present

## 2015-08-13 DIAGNOSIS — Y999 Unspecified external cause status: Secondary | ICD-10-CM | POA: Diagnosis not present

## 2015-08-13 DIAGNOSIS — Z043 Encounter for examination and observation following other accident: Secondary | ICD-10-CM | POA: Diagnosis not present

## 2015-08-13 DIAGNOSIS — Y92410 Unspecified street and highway as the place of occurrence of the external cause: Secondary | ICD-10-CM | POA: Diagnosis not present

## 2015-08-13 DIAGNOSIS — S3992XA Unspecified injury of lower back, initial encounter: Secondary | ICD-10-CM | POA: Diagnosis present

## 2015-08-13 MED ORDER — IBUPROFEN 600 MG PO TABS: 600.0000 mg | ORAL_TABLET | Freq: Four times a day (QID) | ORAL | Status: DC | PRN

## 2015-08-13 MED ORDER — IBUPROFEN 400 MG PO TABS
600.0000 mg | ORAL_TABLET | Freq: Once | ORAL | Status: AC
  Administered 2015-08-13: 600 mg via ORAL
  Filled 2015-08-13: qty 1

## 2015-08-13 NOTE — Discharge Instructions (Signed)
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
## 2015-08-14 ENCOUNTER — Ambulatory Visit (INDEPENDENT_AMBULATORY_CARE_PROVIDER_SITE_OTHER): Payer: 59 | Admitting: Emergency Medicine

## 2015-08-14 ENCOUNTER — Ambulatory Visit (INDEPENDENT_AMBULATORY_CARE_PROVIDER_SITE_OTHER): Payer: 59

## 2015-08-14 VITALS — BP 124/84 | HR 93 | Temp 98.7°F | Resp 15 | Ht 60.0 in | Wt 204.6 lb

## 2015-08-14 DIAGNOSIS — R200 Anesthesia of skin: Secondary | ICD-10-CM

## 2015-08-14 DIAGNOSIS — S134XXA Sprain of ligaments of cervical spine, initial encounter: Secondary | ICD-10-CM

## 2015-08-14 DIAGNOSIS — S8001XA Contusion of right knee, initial encounter: Secondary | ICD-10-CM

## 2015-08-14 DIAGNOSIS — R208 Other disturbances of skin sensation: Secondary | ICD-10-CM | POA: Diagnosis not present

## 2015-08-14 DIAGNOSIS — S199XXA Unspecified injury of neck, initial encounter: Secondary | ICD-10-CM | POA: Diagnosis not present

## 2015-08-14 DIAGNOSIS — S8991XA Unspecified injury of right lower leg, initial encounter: Secondary | ICD-10-CM | POA: Diagnosis not present

## 2015-08-14 MED FILL — IBUPROFEN 600 MG TABLET: 600 | 7 days supply | Qty: 30 | Fill #0

## 2015-08-14 NOTE — Patient Instructions (Addendum)
   IF you received an x-ray today, you will receive an invoice from [facility]. Please contact [facility] at [phone] with questions or concerns regarding your invoice.   IF you received labwork today, you will receive an invoice from [facility]. Please contact [facility] at [phone] with questions or concerns regarding your invoice.   Our billing staff will not be able to assist you with questions regarding bills from these companies.  You will be contacted with the lab results as soon as they are available. The fastest way to get your results is to activate your My Chart account. Instructions are located on the last page of this paperwork. If you have not heard from us regarding the results in 2 weeks, please contact this office.     Cervical Sprain A cervical sprain is an injury in the neck in which the strong, fibrous tissues (ligaments) that connect your neck bones stretch or tear. Cervical sprains can range from mild to severe. Severe cervical sprains can cause the neck vertebrae to be unstable. This can lead to damage of the spinal cord and can result in serious nervous system problems. The amount of time it takes for a cervical sprain to get better depends on the cause and extent of the injury. Most cervical sprains heal in 1 to 3 weeks. CAUSES  Severe cervical sprains may be caused by:   Contact sport injuries (such as from football, rugby, wrestling, hockey, auto racing, gymnastics, diving, martial arts, or boxing).   Motor vehicle collisions.   Whiplash injuries. This is an injury from a sudden forward and backward whipping movement of the head and neck.  Falls.  Mild cervical sprains may be caused by:   Being in an awkward position, such as while cradling a telephone between your ear and shoulder.   Sitting in a chair that does not offer proper support.   Working at a poorly designed computer station.   Looking  up or down for long periods of time.  SYMPTOMS   Pain, soreness, stiffness, or a burning sensation in the front, back, or sides of the neck. This discomfort may develop immediately after the injury or slowly, 24 hours or more after the injury.   Pain or tenderness directly in the middle of the back of the neck.   Shoulder or upper back pain.   Limited ability to move the neck.   Headache.   Dizziness.   Weakness, numbness, or tingling in the hands or arms.   Muscle spasms.   Difficulty swallowing or chewing.   Tenderness and swelling of the neck.  DIAGNOSIS  Most of the time your health care provider can diagnose a cervical sprain by taking your history and doing a physical exam. Your health care provider will ask about previous neck injuries and any known neck problems, such as arthritis in the neck. X-rays may be taken to find out if there are any other problems, such as with the bones of the neck. Other tests, such as a CT scan or MRI, may also be needed.  TREATMENT  Treatment depends on the severity of the cervical sprain. Mild sprains can be treated with rest, keeping the neck in place (immobilization), and pain medicines. Severe cervical sprains are immediately immobilized. Further treatment is done to help with pain, muscle spasms, and other symptoms and may include:  Medicines, such as pain relievers, numbing medicines, or muscle relaxants.   Physical therapy. This may involve stretching exercises, strengthening   exercises, and posture training. Exercises and improved posture can help stabilize the neck, strengthen muscles, and help stop symptoms from returning.  HOME CARE INSTRUCTIONS   Put ice on the injured area.   Put ice in a plastic bag.   Place a towel between your skin and the bag.   Leave the ice on for 15-20 minutes, 3-4 times a day.   If your injury was severe, you may have been given a cervical collar to wear. A cervical collar is a  two-piece collar designed to keep your neck from moving while it heals.  Do not remove the collar unless instructed by your health care provider.  If you have long hair, keep it outside of the collar.  Ask your health care provider before making any adjustments to your collar. Minor adjustments may be required over time to improve comfort and reduce pressure on your chin or on the back of your head.  Ifyou are allowed to remove the collar for cleaning or bathing, follow your health care provider's instructions on how to do so safely.  Keep your collar clean by wiping it with mild soap and water and drying it completely. If the collar you have been given includes removable pads, remove them every 1-2 days and hand wash them with soap and water. Allow them to air dry. They should be completely dry before you wear them in the collar.  If you are allowed to remove the collar for cleaning and bathing, wash and dry the skin of your neck. Check your skin for irritation or sores. If you see any, tell your health care provider.  Do not drive while wearing the collar.   Only take over-the-counter or prescription medicines for pain, discomfort, or fever as directed by your health care provider.   Keep all follow-up appointments as directed by your health care provider.   Keep all physical therapy appointments as directed by your health care provider.   Make any needed adjustments to your workstation to promote good posture.   Avoid positions and activities that make your symptoms worse.   Warm up and stretch before being active to help prevent problems.  SEEK MEDICAL CARE IF:   Your pain is not controlled with medicine.   You are unable to decrease your pain medicine over time as planned.   Your activity level is not improving as expected.  SEEK IMMEDIATE MEDICAL CARE IF:   You develop any bleeding.  You develop stomach upset.  You have signs of an allergic reaction to your  medicine.   Your symptoms get worse.   You develop new, unexplained symptoms.   You have numbness, tingling, weakness, or paralysis in any part of your body.  MAKE SURE YOU:   Understand these instructions.  Will watch your condition.  Will get help right away if you are not doing well or get worse.   This information is not intended to replace advice given to you by your health care provider. Make sure you discuss any questions you have with your health care provider.   Document Released: 12/26/2006 Document Revised: 03/05/2013 Document Reviewed: 09/05/2012 Elsevier Interactive Patient Education 2016 Elsevier Inc.  
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
## 2015-08-17 ENCOUNTER — Ambulatory Visit (INDEPENDENT_AMBULATORY_CARE_PROVIDER_SITE_OTHER): Payer: 59 | Admitting: Emergency Medicine

## 2015-08-17 ENCOUNTER — Ambulatory Visit (INDEPENDENT_AMBULATORY_CARE_PROVIDER_SITE_OTHER): Payer: 59

## 2015-08-17 VITALS — BP 122/82 | HR 82 | Temp 98.0°F | Resp 18 | Ht 60.0 in | Wt 206.7 lb

## 2015-08-17 DIAGNOSIS — S134XXA Sprain of ligaments of cervical spine, initial encounter: Secondary | ICD-10-CM | POA: Diagnosis not present

## 2015-08-17 DIAGNOSIS — S39012D Strain of muscle, fascia and tendon of lower back, subsequent encounter: Secondary | ICD-10-CM

## 2015-08-17 DIAGNOSIS — R208 Other disturbances of skin sensation: Secondary | ICD-10-CM | POA: Diagnosis not present

## 2015-08-17 DIAGNOSIS — S8001XA Contusion of right knee, initial encounter: Secondary | ICD-10-CM | POA: Diagnosis not present

## 2015-08-17 DIAGNOSIS — M545 Low back pain: Secondary | ICD-10-CM | POA: Diagnosis not present

## 2015-08-17 DIAGNOSIS — M533 Sacrococcygeal disorders, not elsewhere classified: Secondary | ICD-10-CM

## 2015-08-17 DIAGNOSIS — R200 Anesthesia of skin: Secondary | ICD-10-CM

## 2015-08-17 DIAGNOSIS — S3992XA Unspecified injury of lower back, initial encounter: Secondary | ICD-10-CM | POA: Diagnosis not present

## 2015-08-17 MED ORDER — CYCLOBENZAPRINE HCL 5 MG PO TABS: 5.0000 mg | ORAL_TABLET | Freq: Three times a day (TID) | ORAL | Status: DC | PRN

## 2015-08-17 MED FILL — CYCLOBENZAPRINE 5 MG TABLET: 5 | 10 days supply | Qty: 30 | Fill #0
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
## 2015-08-17 NOTE — Patient Instructions (Addendum)
I have made you an appointment to see the orthopedist. Try an alternate heat and cold compresses to the areas of pain. I have also prescribed a muscle relaxant you can take with the ibuprofen. I have given you a note to be out of work one week.    IF you received an x-ray today, you will receive an [facility]. Please contact [facility] at [phone] with questions or concerns regarding your invoice.   IF you received labwork today, you will receive an [facility] from [facility]. Please contact [facility] at [phone] with questions or concerns regarding your invoice.   Our billing staff will not be able to assist you with questions regarding bills from these companies.  You will be contacted with the lab results as soon as they are available. The fastest way to get your results is to activate your My Chart account. Instructions are located on the last page of this paperwork. If you have not heard from us regarding the results in 2 weeks, please contact this office.

## 2015-08-20 ENCOUNTER — Telehealth: Payer: Self-pay

## 2015-08-20 NOTE — Telephone Encounter (Signed)
Patient called to check on the referral status. She asked if someone could call her. [phone]

## 2015-08-24 DIAGNOSIS — M545 Low back pain: Secondary | ICD-10-CM | POA: Diagnosis not present

## 2015-08-24 NOTE — Telephone Encounter (Signed)
Left message for patient with information regarding her referral.  We sent [facility] referral to [facility] on 08/19/15.  Their office number is [phone].

## 2015-09-01 ENCOUNTER — Telehealth: Payer: Self-pay
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
## 2015-09-07 NOTE — Telephone Encounter (Signed)
Paperwork scanned and faxed to [employer] on 09/07/15

## 2015-09-18 ENCOUNTER — Telehealth: Payer: Self-pay

## 2015-09-18 NOTE — Telephone Encounter (Signed)
Pt is needing a copy of all spine and rt knee and lower back xrays   Please call [phone] once ready to pick up

## 2015-09-19 NOTE — Telephone Encounter (Signed)
Pt has already picked up CD

## 2015-09-25 MED FILL — CYCLOBENZAPRINE 5 MG TABLET: 5 | 10 days supply | Qty: 30 | Fill #1

## 2016-01-18 DIAGNOSIS — Z124 Encounter for screening for malignant neoplasm of cervix: Secondary | ICD-10-CM | POA: Diagnosis not present

## 2016-01-18 DIAGNOSIS — N898 Other specified noninflammatory disorders of vagina: Secondary | ICD-10-CM | POA: Diagnosis not present

## 2016-01-18 DIAGNOSIS — Z6841 Body Mass Index (BMI) 40.0 and over, adult: Secondary | ICD-10-CM | POA: Diagnosis not present

## 2016-01-18 DIAGNOSIS — Z113 Encounter for screening for infections with a predominantly sexual mode of transmission: Secondary | ICD-10-CM | POA: Diagnosis not present

## 2016-01-18 DIAGNOSIS — Z01419 Encounter for gynecological examination (general) (routine) without abnormal findings: Secondary | ICD-10-CM | POA: Diagnosis not present

## 2016-01-18 MED FILL — metroNIDAZOLE 500 MG TABS: 500 | 7 days supply | Qty: 14 | Fill #0

## 2016-01-21 ENCOUNTER — Emergency Department (HOSPITAL_COMMUNITY)
Admission: EM | Admit: 2016-01-21 | Discharge: 2016-01-21 | Disposition: A | Discharge status: Home / Self Care | Payer: 59 | Attending: Emergency Medicine | Admitting: Emergency Medicine

## 2016-01-21 ENCOUNTER — Encounter (HOSPITAL_COMMUNITY): Payer: Self-pay | Admitting: Emergency Medicine

## 2016-01-21 DIAGNOSIS — R202 Paresthesia of skin: Secondary | ICD-10-CM | POA: Insufficient documentation

## 2016-01-21 DIAGNOSIS — F17210 Nicotine dependence, cigarettes, uncomplicated: Secondary | ICD-10-CM | POA: Insufficient documentation

## 2016-01-21 NOTE — ED Triage Notes (Signed)
Pt from home with c/o intermittent left arm pain with tingling and numbness x 3 weeks.  Pt in NAD, A&O.

## 2016-01-21 NOTE — Discharge Instructions (Signed)
Please read attached information. If you experience any new or worsening signs or symptoms please return to the emergency room for evaluation. Please follow-up with your primary care provider or specialist as discussed. Please use medication prescribed only as directed and discontinue taking if you have any concerning signs or symptoms.   °

## 2016-01-21 NOTE — ED Provider Notes (Signed)
[facility] Provider Note   CSN: [id number] Arrival date & time: 01/21/16  1133  By signing my name below, I, Sonum Patel, attest that this documentation has been prepared under the direction and in the presence of Newell RubbermaidJeffrey Jahziah Simonin, PA-C. Electronically Signed: Sonum Patel, Neurosurgeoncribe. 01/21/16. 11:58 AM.  History   Chief Complaint Chief Complaint  Patient presents with  . Arm Pain     The history is provided by the patient. No language interpreter was used.     HPI Comments: Hannah Morton is a 25 y.o. female who presents to the Emergency Department complaining of 3 weeks of intermittent, unchanged left arm paresthesias with associated mild left shoulder pain. She reports occasional episodes to the RUE. She reports having a nexplanon to the LUE and is unsure if this is related to the implant. She has a follow up appointment with her gynecologist to possibly have it removed. She also complains of recent night sweats. She denies fever, swelling, confusion, slurred speech, change in HA's, weakness, weight loss.   History reviewed. No pertinent past medical history.  Patient Active Problem List   Diagnosis Date Noted  . Active labor 09/04/2013  . Normal delivery 09/04/2013    History reviewed. No pertinent surgical history.  OB History    Gravida Para Term Preterm AB Living   3 2 2   1 2    SAB TAB Ectopic Multiple Live Births     1     2       Home Medications    Prior to Admission medications   Medication Sig Start Date End Date Taking? Authorizing Provider  cyclobenzaprine (FLEXERIL) 5 MG tablet Take 1 tablet (5 mg total) by mouth 3 (three) times daily as needed for muscle spasms. 08/17/15   Collene GobbleSteven A Daub, MD  etonogestrel (NEXPLANON) 68 MG IMPL implant 1 each by Subdermal route once. Reported on 08/14/2015    Historical Provider, MD  ibuprofen (ADVIL,MOTRIN) 600 MG tablet Take 1 tablet (600 mg total) by mouth every 6 (six) hours as needed. 08/13/15   Lyndal Pulleyaniel Knott, MD    SUMAtriptan (IMITREX) 50 MG tablet Take 50 mg by mouth every 2 (two) hours as needed for migraine. May repeat in 2 hours if headache persists or recurs.    Historical Provider, MD    Family History History reviewed. No pertinent family history.  Social History Social History  Substance Use Topics  . Smoking status: Current Some Day Smoker    Types: Cigarettes  . Smokeless tobacco: Never Used     Comment: Only when drinking  . Alcohol use 1.2 oz/week    2 Standard drinks or equivalent per week     Allergies   Codeine   Review of Systems Review of Systems  A complete 10 system review of systems was obtained and all systems are negative except as noted in the HPI and PMH.    Physical Exam Updated Vital Signs BP 158/100 (BP Location: Right Arm)   Pulse 94   Resp 16   SpO2 100%   Physical Exam  Constitutional: She is oriented to person, place, and time. She appears well-developed and well-nourished.  HENT:  Head: Normocephalic and atraumatic.  Cardiovascular: Normal rate.   Pulmonary/Chest: Effort normal.  Musculoskeletal: Normal range of motion. She exhibits no edema, tenderness or deformity.  Neurological: She is alert and oriented to person, place, and time. She has normal strength. No cranial nerve deficit or sensory deficit. She exhibits normal muscle tone. She  displays a negative Romberg sign. Coordination normal. GCS eye subscore is 4. GCS verbal subscore is 5. GCS motor subscore is 6.  Negative finger to nose testing   Skin: Skin is warm and dry.  Psychiatric: She has a normal mood and affect.  Nursing note and vitals reviewed.    ED Treatments / Results  DIAGNOSTIC STUDIES: Oxygen Saturation is 100% on RA, normal by my interpretation.    COORDINATION OF CARE: 11:58 AM Discussed treatment plan with pt at bedside and pt agreed to plan.   Labs (all labs ordered are listed, but only abnormal results are displayed) Labs Reviewed - No data to display  EKG   EKG Interpretation None       Radiology No results found.  Procedures Procedures (including critical care time)  Medications Ordered in ED Medications - No data to display   Initial Impression / Assessment and Plan / ED Course  I have reviewed the triage vital signs and the nursing notes.  Pertinent labs & imaging results that were available during my care of the patient were reviewed by me and considered in my medical decision making (see chart for details).  Clinical Course     Labs:  Imaging:  Consults:  Therapeutics:  Discharge Meds:   Assessment/Plan:25 year old female presents today with very vague paresthesia complaints. She reports this is located along the left arm from the elbow to the shoulder. This does not extend down in the hand presently, but sometimes does. She also reports she's had similar symptoms in the right arm as well. She has no strength deficits, no sensory deficits on my exam. Patient has tenderness to the shoulder and muscles of the neck. Patient has no acute neurological deficits that would indicate central cause for this paresthesia. Patient will need follow-up with primary care, she is given strict return precautions, she verbalized understanding and agreement to today's plan had no further questions or concerns  Final Clinical Impressions(s) / ED Diagnoses   Final diagnoses:  Paresthesia    New Prescriptions Discharge Medication List as of 01/21/2016 12:07 PM     I personally performed the services described in this documentation, which was scribed in my presence. The recorded information has been reviewed and is accurate.    Eyvonne MechanicJeffrey Lashayla Armes, PA-C 01/21/16 1412    Benjiman CoreNathan Pickering, MD 01/21/16 [phone]

## 2016-01-21 NOTE — ED Notes (Signed)
Pt is in stable condition upon d/c and ambulates from ED. 

## 2016-02-17 DIAGNOSIS — Z4589 Encounter for adjustment and management of other implanted devices: Secondary | ICD-10-CM | POA: Diagnosis not present

## 2016-02-26 MED FILL — NORETHIND-ETH ESTRAD 1-0.02: 1-20 | 63 days supply | Qty: 63 | Fill #0

## 2016-03-10 ENCOUNTER — Encounter (HOSPITAL_COMMUNITY): Payer: Self-pay | Admitting: Family Medicine

## 2016-03-10 ENCOUNTER — Ambulatory Visit (HOSPITAL_COMMUNITY)
Admission: EM | Admit: 2016-03-10 | Discharge: 2016-03-10 | Disposition: A | Discharge status: Home / Self Care | Payer: 59 | Attending: Family Medicine | Admitting: Family Medicine

## 2016-03-10 DIAGNOSIS — B309 Viral conjunctivitis, unspecified: Secondary | ICD-10-CM | POA: Diagnosis not present

## 2016-03-10 MED ORDER — POLYMYXIN B-TRIMETHOPRIM 10000-0.1 UNIT/ML-% OP SOLN: 1.0000 [drp] | OPHTHALMIC | 0 refills | Status: DC

## 2016-03-10 MED FILL — POLYMYXIN B/TMP EYE DROPS: 10000-0.1 | 25 days supply | Qty: 10 | Fill #0

## 2016-03-10 NOTE — ED Provider Notes (Signed)
[facility]    CSN: [id number] Arrival date & time: 03/10/16  1450     History   Chief Complaint Chief Complaint  Patient presents with  . Eye Problem    HPI Hannah Morton is a 25 y.o. female.   The history is provided by the patient.  Eye Problem  Location:  Left eye Quality:  Tearing Severity:  Mild Onset quality:  Sudden Duration:  12 hours Progression:  Unchanged Chronicity:  New Context comment:  Sick child exposure. Relieved by:  None tried Ineffective treatments:  None tried Associated symptoms: discharge and tearing   Associated symptoms: no double vision, no inflammation, no itching, no photophobia and no redness     History reviewed. No pertinent past medical history.  Patient Active Problem List   Diagnosis Date Noted  . Active labor 09/04/2013  . Normal delivery 09/04/2013    History reviewed. No pertinent surgical history.  OB History    Gravida Para Term Preterm AB Living   3 2 2   1 2    SAB TAB Ectopic Multiple Live Births     1     2       Home Medications    Prior to Admission medications   Medication Sig Start Date End Date Taking? Authorizing Provider  cyclobenzaprine (FLEXERIL) 5 MG tablet Take 1 tablet (5 mg total) by mouth 3 (three) times daily as needed for muscle spasms. 08/17/15   Collene GobbleSteven A Daub, MD  etonogestrel (NEXPLANON) 68 MG IMPL implant 1 each by Subdermal route once. Reported on 08/14/2015    Historical Provider, MD  ibuprofen (ADVIL,MOTRIN) 600 MG tablet Take 1 tablet (600 mg total) by mouth every 6 (six) hours as needed. 08/13/15   Lyndal Pulleyaniel Knott, MD  SUMAtriptan (IMITREX) 50 MG tablet Take 50 mg by mouth every 2 (two) hours as needed for migraine. May repeat in 2 hours if headache persists or recurs.    Historical Provider, MD  trimethoprim-polymyxin b (POLYTRIM) ophthalmic solution Place 1 drop into the left eye every 4 (four) hours. 03/10/16   Linna HoffJames D Glynis Hunsucker, MD    Family History History reviewed. No  pertinent family history.  Social History Social History  Substance Use Topics  . Smoking status: Current Some Day Smoker    Types: Cigarettes  . Smokeless tobacco: Never Used     Comment: Only when drinking  . Alcohol use 1.2 oz/week    2 Standard drinks or equivalent per week     Allergies   Codeine   Review of Systems Review of Systems  Constitutional: Negative.   HENT: Negative.   Eyes: Positive for discharge. Negative for double vision, photophobia, redness, itching and visual disturbance.  All other systems reviewed and are negative.    Physical Exam Triage Vital Signs ED Triage Vitals [03/10/16 1526]  Enc Vitals Group     BP 132/73     Pulse Rate 67     Resp 16     Temp 98.2 F (36.8 C)     Temp src      SpO2 100 %     Weight      Height      Head Circumference      Peak Flow      Pain Score 4     Pain Loc      Pain Edu?      Excl. in GC?    No data found.   Updated Vital Signs BP 132/73  Pulse 67   Temp 98.2 F (36.8 C)   Resp 16   SpO2 100%   Visual Acuity Right Eye Distance: 20/20 Left Eye Distance: 20/15 Bilateral Distance: 20/15  Right Eye Near:   Left Eye Near:    Bilateral Near:     Physical Exam  Constitutional: She appears well-developed and well-nourished. No distress.  HENT:  Right Ear: External ear normal.  Left Ear: External ear normal.  Nose: Nose normal.  Mouth/Throat: Oropharynx is clear and moist.  Eyes: EOM and lids are normal. Pupils are equal, round, and reactive to light. Lids are everted and swept, no foreign bodies found. Right eye exhibits no discharge. Left eye exhibits discharge. Right conjunctiva is not injected. Left conjunctiva is not injected.  Nursing note and vitals reviewed.    UC Treatments / Results  Labs (all labs ordered are listed, but only abnormal results are displayed) Labs Reviewed - No data to display  EKG  EKG Interpretation None       Radiology No results  found.  Procedures Procedures (including critical care time)  Medications Ordered in UC Medications - No data to display   Initial Impression / Assessment and Plan / UC Course  I have reviewed the triage vital signs and the nursing notes.  Pertinent labs & imaging results that were available during my care of the patient were reviewed by me and considered in my medical decision making (see chart for details).  Clinical Course       Final Clinical Impressions(s) / UC Diagnoses   Final diagnoses:  Acute viral conjunctivitis of left eye    New Prescriptions Discharge Medication List as of 03/10/2016  4:48 PM    START taking these medications   Details  trimethoprim-polymyxin b (POLYTRIM) ophthalmic solution Place 1 drop into the left eye every 4 (four) hours., Starting Thu 03/10/2016, Print         Linna HoffJames D Shterna Laramee, MD 03/10/16 2111

## 2016-03-10 NOTE — ED Triage Notes (Signed)
Pt here for right eye redness and irritation since this am. sts throbbing, denies itchiness.

## 2016-04-27 DIAGNOSIS — R002 Palpitations: Secondary | ICD-10-CM | POA: Diagnosis not present

## 2016-05-05 MED FILL — NORETHIND-ETH ESTRAD 1-0.02: 1-20 | 63 days supply | Qty: 63 | Fill #1

## 2016-09-10 ENCOUNTER — Emergency Department (HOSPITAL_COMMUNITY)
Admission: EM | Admit: 2016-09-10 | Discharge: 2016-09-10 | Disposition: A | Discharge status: Home / Self Care | Payer: Medicaid Other | Attending: Emergency Medicine | Admitting: Emergency Medicine

## 2016-09-10 ENCOUNTER — Encounter (HOSPITAL_COMMUNITY): Payer: Self-pay | Admitting: *Deleted

## 2016-09-10 DIAGNOSIS — Z5321 Procedure and treatment not carried out due to patient leaving prior to being seen by health care provider: Secondary | ICD-10-CM | POA: Insufficient documentation

## 2016-09-10 DIAGNOSIS — R200 Anesthesia of skin: Secondary | ICD-10-CM | POA: Diagnosis not present

## 2016-09-10 NOTE — ED Triage Notes (Signed)
Pt states she is having a numbness type "going to sleep" symptoms move up ext. Has been seen before for same without solution

## 2016-09-10 NOTE — ED Notes (Signed)
Called pt for room 2nd time, no response 

## 2016-09-10 NOTE — ED Notes (Signed)
Called pt, did not respond

## 2016-09-10 NOTE — ED Notes (Signed)
Bed: WTR6 Expected date:  Expected time:  Means of arrival:  Comments: 

## 2016-09-11 ENCOUNTER — Encounter (HOSPITAL_COMMUNITY): Payer: Self-pay | Admitting: Emergency Medicine

## 2016-09-11 ENCOUNTER — Emergency Department (HOSPITAL_COMMUNITY)
Admission: EM | Admit: 2016-09-11 | Discharge: 2016-09-11 | Disposition: A | Discharge status: Home / Self Care | Payer: Medicaid Other | Attending: Emergency Medicine | Admitting: Emergency Medicine

## 2016-09-11 DIAGNOSIS — R202 Paresthesia of skin: Secondary | ICD-10-CM | POA: Insufficient documentation

## 2016-09-11 DIAGNOSIS — Z79899 Other long term (current) drug therapy: Secondary | ICD-10-CM | POA: Insufficient documentation

## 2016-09-11 DIAGNOSIS — F17210 Nicotine dependence, cigarettes, uncomplicated: Secondary | ICD-10-CM | POA: Diagnosis not present
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
## 2016-09-11 NOTE — ED Triage Notes (Addendum)
Reports numbness in left hand and arm on and off for about a week.  Reports having nexplanon removed from the left arm three months ago and that arm has felt weird since.  Checked into [facility] earlier tonight but came here because the wait was too long.

## 2016-09-11 NOTE — ED Notes (Signed)
ED Provider at bedside. 

## 2016-09-11 NOTE — Discharge Instructions (Signed)
Your exam did not show any signs of nerve damage. Please follow up with the neurologist to make sure you do not have multiple sclerosis.

## 2017-01-29 ENCOUNTER — Emergency Department (HOSPITAL_COMMUNITY)
Admission: EM | Admit: 2017-01-29 | Discharge: 2017-01-29 | Disposition: A | Discharge status: Home / Self Care | Payer: Medicaid Other | Attending: Emergency Medicine | Admitting: Emergency Medicine

## 2017-01-29 ENCOUNTER — Emergency Department (HOSPITAL_COMMUNITY): Payer: Medicaid Other

## 2017-01-29 ENCOUNTER — Encounter (HOSPITAL_COMMUNITY): Payer: Self-pay

## 2017-01-29 DIAGNOSIS — R42 Dizziness and giddiness: Secondary | ICD-10-CM | POA: Diagnosis not present

## 2017-01-29 DIAGNOSIS — R0789 Other chest pain: Secondary | ICD-10-CM | POA: Diagnosis not present

## 2017-01-29 DIAGNOSIS — Z79899 Other long term (current) drug therapy: Secondary | ICD-10-CM | POA: Diagnosis not present

## 2017-01-29 DIAGNOSIS — F17210 Nicotine dependence, cigarettes, uncomplicated: Secondary | ICD-10-CM | POA: Diagnosis not present

## 2017-01-29 DIAGNOSIS — R112 Nausea with vomiting, unspecified: Secondary | ICD-10-CM | POA: Diagnosis not present

## 2017-01-29 DIAGNOSIS — R079 Chest pain, unspecified: Secondary | ICD-10-CM | POA: Diagnosis present

## 2017-01-29 DIAGNOSIS — R0602 Shortness of breath: Secondary | ICD-10-CM | POA: Diagnosis not present

## 2017-01-29 LAB — BASIC METABOLIC PANEL
Anion gap: 10 (ref 5–15)
BUN: 11 mg/dL (ref 6–20)
CO2: 22 mmol/L (ref 22–32)
CREATININE: 0.89 mg/dL (ref 0.44–1.00)
Calcium: 9 mg/dL (ref 8.9–10.3)
Chloride: 106 mmol/L (ref 101–111)
GFR calc Af Amer: >60 mL/min (ref >60)
GLUCOSE: 87 mg/dL (ref 65–99)
Potassium: 3.4 mmol/L — ABNORMAL LOW (ref 3.5–5.1)
Sodium: 138 mmol/L (ref 135–145)

## 2017-01-29 LAB — CBC
HCT: 40.1 % (ref 36.0–46.0)
HEMOGLOBIN: 13.3 g/dL (ref 12.0–15.0)
MCH: 28.5 pg (ref 26.0–34.0)
MCHC: 33.2 g/dL (ref 30.0–36.0)
MCV: 85.9 fL (ref 78.0–100.0)
PLATELETS: 249 10*3/uL (ref 150–400)
RBC: 4.67 MIL/uL (ref 3.87–5.11)
RDW: 14.4 % (ref 11.5–15.5)
WBC: 13.8 10*3/uL — ABNORMAL HIGH (ref 4.0–10.5)

## 2017-01-29 LAB — I-STAT TROPONIN, ED: TROPONIN I, POC: 0 ng/mL (ref 0.00–0.08)

## 2017-01-29 LAB — D-DIMER, QUANTITATIVE: D-Dimer, Quant: 0.7 ug/mL-FEU — ABNORMAL HIGH (ref 0.00–0.50)

## 2017-01-29 LAB — POC URINE PREG, ED: Preg Test, Ur: NEGATIVE

## 2017-01-29 MED ORDER — IOPAMIDOL (ISOVUE-370) INJECTION 76%
INTRAVENOUS | Status: AC
  Administered 2017-01-29: 100 mL via INTRAVENOUS
  Filled 2017-01-29: qty 100

## 2017-01-29 NOTE — ED Notes (Signed)
Pt understood dc marterial. NAD noted.

## 2017-01-29 NOTE — ED Provider Notes (Signed)
[facility]   CSN: [id number] Arrival date & time: 01/29/17  1719     History   Chief Complaint Chief Complaint  Patient presents with  . Chest Pain    HPI Hannah Morton is a 26 y.o. female.  The history is provided by the patient. No language interpreter was used.  Chest Pain     Hannah Morton is a 26 y.o. female who presents to the Emergency Department complaining of chest pain.  She reports several months of left-sided chest pain and shortness of breath.  Her pain is been intermittent in nature.  Today she reports that she had associated dizziness and lightheadedness and sensation like she may left out.  She also reports that her chest pain has been more frequent today.  No fevers, cough, abdominal pain.  She did have nausea and vomiting once with mild diarrhea.  No leg swelling or pain.  She did have a Nexplanon but this was removed about a year ago.  She is having regular cycles and does not believe she is pregnant.  She does not smoke or drink alcohol. History reviewed. No pertinent past medical history.  Patient Active Problem List   Diagnosis Date Noted  . Active labor 09/04/2013  . Normal delivery 09/04/2013    History reviewed. No pertinent surgical history.  OB History    Gravida Para Term Preterm AB Living   3 2 2   1 2    SAB TAB Ectopic Multiple Live Births     1     2       Home Medications    Prior to Admission medications   Medication Sig Start Date End Date Taking? Authorizing Provider  cyclobenzaprine (FLEXERIL) 5 MG tablet Take 1 tablet (5 mg total) by mouth 3 (three) times daily as needed for muscle spasms. 08/17/15   Collene Gobbleaub, Steven A, MD  etonogestrel (NEXPLANON) 68 MG IMPL implant 1 each by Subdermal route once. Reported on 08/14/2015    [provider]  ibuprofen (ADVIL,MOTRIN) 600 MG tablet Take 1 tablet (600 mg total) by mouth every 6 (six) hours as needed. 08/13/15   Lyndal PulleyKnott,  Daniel, MD  SUMAtriptan (IMITREX) 50 MG tablet Take 50 mg by mouth every 2 (two) hours as needed for migraine. May repeat in 2 hours if headache persists or recurs.    [provider]  trimethoprim-polymyxin b (POLYTRIM) ophthalmic solution Place 1 drop into the left eye every 4 (four) hours. 03/10/16   Linna HoffKindl, James D, MD    Family History No family history on file.  Social History Social History   Tobacco Use  . Smoking status: Current Some Day Smoker    Types: Cigarettes  . Smokeless tobacco: Never Used  . Tobacco comment: Only when drinking  Substance Use Topics  . Alcohol use: Yes    Alcohol/week: 1.2 oz    Types: 2 Standard drinks or equivalent per week  . Drug use: No     Allergies   Codeine   Review of Systems Review of Systems  Cardiovascular: Positive for chest pain.  All other systems reviewed and are negative.    Physical Exam Updated Vital Signs BP (!) 141/88 (BP Location: Right Arm)   Pulse 98   Temp 98.9 F (37.2 C) (Oral)   Resp 18   Ht 5' (1.524 m)   Wt 85.7 kg (189 lb)   LMP 01/05/2017   SpO2 100%   BMI 36.91 kg/m  Physical Exam  Constitutional: She is oriented to person, place, and time. She appears well-developed and well-nourished.  HENT:  Head: Normocephalic and atraumatic.  Cardiovascular: Normal rate, regular rhythm and normal pulses.  No murmur heard. Pulses:      Radial pulses are 2+ on the right side, and 2+ on the left side.  Pulmonary/Chest: Effort normal and breath sounds normal. No respiratory distress.  Mild left upper chest wall tenderness to palpation  Abdominal: Soft. There is no tenderness. There is no rebound and no guarding.  Musculoskeletal: She exhibits no edema or tenderness.  Neurological: She is alert and oriented to person, place, and time.  Skin: Skin is warm and dry.  Psychiatric: She has a normal mood and affect. Her behavior is normal.  Nursing note and vitals reviewed.    ED Treatments /  Results  Labs (all labs ordered are listed, but only abnormal results are displayed) Labs Reviewed  BASIC METABOLIC PANEL - Abnormal; Notable for the following components:      Result Value   Potassium 3.4 (*)    All other components within normal limits  CBC - Abnormal; Notable for the following components:   WBC 13.8 (*)    All other components within normal limits  D-DIMER, QUANTITATIVE (NOT AT [facility])  I-STAT TROPONIN, ED  POC URINE PREG, ED    EKG  EKG Interpretation None       Radiology Dg Chest 2 View  Result Date: 01/29/2017 CLINICAL DATA:  Intermittent left-sided chest pain with palpitations for 6 months. Dizziness today. EXAM: CHEST  2 VIEW COMPARISON:  None. FINDINGS: The heart size and mediastinal contours are within normal limits. Both lungs are clear. The visualized skeletal structures are unremarkable. IMPRESSION: No active cardiopulmonary disease. Electronically Signed   By: Gerome Samavid  Williams III M.D   On: 01/29/2017 18:04    Procedures Procedures (including critical care time)  Medications Ordered in ED Medications - No data to display   Initial Impression / Assessment and Plan / ED Course  I have reviewed the triage vital signs and the nursing notes.  Pertinent labs & imaging results that were available during my care of the patient were reviewed by me and considered in my medical decision making (see chart for details).     Patient here for evaluation of several months of chest pain with intermittent palpitations and shortness of breath with near dizziness today.  She does have some reproducible chest wall tenderness on examination.  Presentation is not consistent with ACS, CHF, pneumonia.  D-dimer is mildly elevated, CT PA obtained.  CT is negative for PE.  Discussed with patients chest wall pain and home care with ibuprofen or Tylenol.  Discussed importance of PCP follow-up as well as return precautions.  Final Clinical Impressions(s) / ED Diagnoses    Final diagnoses:  None    ED Discharge Orders    None       Tilden Fossaees, Sharise Lippy, MD 01/29/17 2137

## 2017-01-29 NOTE — ED Triage Notes (Signed)
Per Pt, Pt is coming from home with complaints of Chest pain, dizziness and lightheadedness, along with palpitations. Pt reports some SOB.

## 2017-01-29 NOTE — Discharge Instructions (Signed)
You can take ibuprofen, available over the counter according to label instructions as needed for pain.   °

## 2017-01-29 NOTE — ED Notes (Signed)
Patient transported to CT 

## 2017-01-29 NOTE — ED Notes (Signed)
Called lab and added on D-Dimer

## 2017-03-28 ENCOUNTER — Emergency Department (HOSPITAL_COMMUNITY): Payer: Self-pay

## 2017-03-28 ENCOUNTER — Encounter (HOSPITAL_COMMUNITY): Payer: Self-pay | Admitting: Emergency Medicine

## 2017-03-28 ENCOUNTER — Emergency Department (HOSPITAL_COMMUNITY)
Admission: EM | Admit: 2017-03-28 | Discharge: 2017-03-28 | Disposition: A | Discharge status: Home / Self Care | Payer: Self-pay | Attending: Emergency Medicine | Admitting: Emergency Medicine

## 2017-03-28 ENCOUNTER — Other Ambulatory Visit: Payer: Self-pay

## 2017-03-28 DIAGNOSIS — R0789 Other chest pain: Secondary | ICD-10-CM | POA: Insufficient documentation

## 2017-03-28 LAB — BASIC METABOLIC PANEL
Anion gap: 10 (ref 5–15)
BUN: 9 mg/dL (ref 6–20)
CHLORIDE: 106 mmol/L (ref 101–111)
CO2: 21 mmol/L — AB (ref 22–32)
Calcium: 9.1 mg/dL (ref 8.9–10.3)
Creatinine, Ser: 0.61 mg/dL (ref 0.44–1.00)
GFR calc non Af Amer: >60 mL/min (ref >60)
Glucose, Bld: 85 mg/dL (ref 65–99)
Potassium: 3.7 mmol/L (ref 3.5–5.1)
Sodium: 137 mmol/L (ref 135–145)

## 2017-03-28 LAB — CBC
HEMATOCRIT: 42.3 % (ref 36.0–46.0)
HEMOGLOBIN: 14.1 g/dL (ref 12.0–15.0)
MCH: 28.7 pg (ref 26.0–34.0)
MCHC: 33.3 g/dL (ref 30.0–36.0)
MCV: 86.2 fL (ref 78.0–100.0)
Platelets: 180 10*3/uL (ref 150–400)
RBC: 4.91 MIL/uL (ref 3.87–5.11)
RDW: 13.8 % (ref 11.5–15.5)
WBC: 8.3 10*3/uL (ref 4.0–10.5)

## 2017-03-28 LAB — I-STAT BETA HCG BLOOD, ED (MC, WL, AP ONLY): I-stat hCG, quantitative: <5 m[IU]/mL (ref <5)

## 2017-03-28 LAB — I-STAT TROPONIN, ED: TROPONIN I, POC: 0.01 ng/mL (ref 0.00–0.08)

## 2017-03-28 NOTE — ED Provider Notes (Signed)
[facility] EMERGENCY DEPARTMENT Provider Note   CSN: [id number] Arrival date & time: 03/28/17  0848     History   Chief Complaint Chief Complaint  Patient presents with  . Chest Pain  . Palpitations    HPI Hannah Morton is a 27 y.o. female who presents to the ED today for chest pains.  Patient notes that over the last several months she has been having intermittent episodes of chest pain.  States her most recent episode was 2 days ago when she was taking clothes out of the dryer when she felt pressure in her central chest without radiation to her neck, jaw, shoulder or back and without any associated nausea, shortness of breath, diaphoresis that lasted for approximately <1 min.  She states she did have some associated palpitations with this.  She admits to drinks caffeinated beverages and sodas frequently before these episodes occur. She is asymptomatic today. Pain is not exertional, pleuritic, or positional in nature.  When asked about arm tingling in triage she told the triage nurse she did have this. When I asked, she noted she previously had some left arm tingling after receiving a nexplanon implant. She has since had this removed and is asymptomatic from this greater then 6 months ago. Patient is a never smoker. No Fhx of heart disease. Denies risk factors for DVT/PE including exogenous estrogen use (non since nexplanon), recent surgery or travel, trauma, immobilization, smoking, previous blood clot, cough, hemoptysis, cancer, lower extremity pain or swelling, or family history of bleeding/clotting disorder.   HPI  History reviewed. No pertinent past medical history.  Patient Active Problem List   Diagnosis Date Noted  . Active labor 09/04/2013  . Normal delivery 09/04/2013    History reviewed. No pertinent surgical history.  OB History    Gravida Para Term Preterm AB Living   3 2 2   1 2    SAB TAB Ectopic Multiple Live Births     1     2        Home Medications    Prior to Admission medications   Medication Sig Start Date End Date Taking? Authorizing Provider  ibuprofen (ADVIL,MOTRIN) 600 MG tablet Take 1 tablet (600 mg total) by mouth every 6 (six) hours as needed. Patient taking differently: Take 600 mg by mouth every 6 (six) hours as needed for moderate pain.  08/13/15  Yes Lyndal Pulley, MD  cyclobenzaprine (FLEXERIL) 5 MG tablet Take 1 tablet (5 mg total) by mouth 3 (three) times daily as needed for muscle spasms. Patient not taking: Reported on 03/28/2017 08/17/15   Collene Gobble, MD  trimethoprim-polymyxin b Joaquim Lai) ophthalmic solution Place 1 drop into the left eye every 4 (four) hours. Patient not taking: Reported on 03/28/2017 03/10/16   Linna Hoff, MD    Family History No family history on file.  Social History Social History   Tobacco Use  . Smoking status: Never Smoker  . Smokeless tobacco: Never Used  . Tobacco comment: Only when drinking  Substance Use Topics  . Alcohol use: Yes    Alcohol/week: 1.2 oz    Types: 2 Standard drinks or equivalent per week  . Drug use: No     Allergies   Codeine   Review of Systems Review of Systems  All other systems reviewed and are negative.    Physical Exam Updated Vital Signs BP 126/87   Pulse 62   Temp (!) 97.5 F (36.4 C) (Oral)   Resp  19   Ht 5' (1.524 m)   Wt 86.2 kg (190 lb)   SpO2 100%   BMI 37.11 kg/m   Physical Exam  Constitutional: She appears well-developed and well-nourished.  HENT:  Head: Normocephalic and atraumatic.  Right Ear: External ear normal.  Left Ear: External ear normal.  Nose: Nose normal.  Mouth/Throat: Uvula is midline, oropharynx is clear and moist and mucous membranes are normal. No tonsillar exudate.  Eyes: Pupils are equal, round, and reactive to light. Right eye exhibits no discharge. Left eye exhibits no discharge. No scleral icterus.  Neck: Trachea normal. Neck supple. No JVD present. No spinous process  tenderness present. No neck rigidity. Normal range of motion present.  Cardiovascular: Normal rate, regular rhythm and intact distal pulses.  No murmur heard. Pulses:      Radial pulses are 2+ on the right side, and 2+ on the left side.       Dorsalis pedis pulses are 2+ on the right side, and 2+ on the left side.       Posterior tibial pulses are 2+ on the right side, and 2+ on the left side.  No lower extremity swelling or edema. Calves symmetric in size bilaterally.  Pulmonary/Chest: Effort normal and breath sounds normal. She exhibits tenderness.  Abdominal: Soft. Bowel sounds are normal. There is no tenderness. There is no rigidity, no rebound, no guarding and no CVA tenderness.  Musculoskeletal: She exhibits no edema.  Lymphadenopathy:    She has no cervical adenopathy.  Neurological: She is alert.  Skin: Skin is warm and dry. No rash noted. She is not diaphoretic.  Psychiatric: She has a normal mood and affect.  Nursing note and vitals reviewed.    ED Treatments / Results  Labs (all labs ordered are listed, but only abnormal results are displayed) Labs Reviewed  BASIC METABOLIC PANEL - Abnormal; Notable for the following components:      Result Value   CO2 21 (*)    All other components within normal limits  CBC  I-STAT TROPONIN, ED  I-STAT BETA HCG BLOOD, ED ([facility], [facility], [facility] ONLY)    EKG  EKG Interpretation  Date/Time:  Tuesday March 28 2017 08:51:30 EST Ventricular Rate:  75 PR Interval:  128 QRS Duration: 72 QT Interval:  378 QTC Calculation: 422 R Axis:   60 Text Interpretation:  Normal sinus rhythm Cannot rule out Anterior infarct , age undetermined Abnormal ECG since last tracing no significant change Confirmed by Mancel Bale [id number]) on 03/28/2017 1:08:12 PM       Radiology Dg Chest 2 View  Result Date: 03/28/2017 CLINICAL DATA:  Chest pain and palpitations. EXAM: CHEST  2 VIEW COMPARISON:  Chest x-ray and CT chest dated January 29, 2017. FINDINGS: The  heart size and mediastinal contours are within normal limits. Both lungs are clear. The visualized skeletal structures are unremarkable. IMPRESSION: No active cardiopulmonary disease. Electronically Signed   By: Obie Dredge M.D.   On: 03/28/2017 09:36    Procedures Procedures (including critical care time)  Medications Ordered in ED Medications - No data to display   Initial Impression / Assessment and Plan / ED Course  I have reviewed the triage vital signs and the nursing notes.  Pertinent labs & imaging results that were available during my care of the patient were reviewed by me and considered in my medical decision making (see chart for details).      Patient presented with chest pain to the ED. Patient is  to be discharged with recommendation to follow up with PCP in regards to today's hospital visit. Chest pain is not likely of cardiac or pulmonary etiology due to presentation, perc negative, stable vital signs, no tracheal deviation, no JVD or new murmur, RRR, breath sounds equal bilaterally, EKG without acute abnormalities, negative troponin, and negative CXR. HEART score is 0. Patient has been advised to return to the ED if chest pain becomes exertional, associated with diaphoresis or nausea, radiates to left jaw/arm, worsens or becomes concerning in any way. Patient appears reliable for follow up and is agreeable to discharge. I advised the patient to follow-up with their primary care provider this week. I advised the patient to return to the emergency department with new or worsening symptoms or new concerns. The patient verbalized understanding and agreement with plan. Patient stable   Final Clinical Impressions(s) / ED Diagnoses   Final diagnoses:  Atypical chest pain    ED Discharge Orders    None       Princella PellegriniMaczis, Rowland Ericsson M, PA-C 03/28/17 1332    Mancel BaleWentz, Elliott, MD 03/28/17 [phone]

## 2017-03-28 NOTE — ED Triage Notes (Signed)
Onset one week ago developed chest pain and left arm tingling. States symptoms intermittent and symptoms severe 2 days ago. Currently denies symptoms but concerned what is causing the problem.

## 2017-03-28 NOTE — ED Notes (Signed)
Pt stable, ambulatory, states understanding of discharge instructions 

## 2017-03-28 NOTE — Discharge Instructions (Signed)
Read instructions below for reasons to return to the Emergency Department. It is recommended that your follow up with your Primary Care Doctor in regards to today's visit. If you do not have a doctor, use the resource guide listed below to help you find one. Take tylenol or ibuprofen as needed for pain. Avoid caffeine or stimulants.   Tests performed today include: An EKG of your heart A chest x-ray Cardiac enzymes - a blood test for heart muscle damage Blood counts and electrolytes Vital signs. See below for your results today.   Chest Pain (Nonspecific)  HOME CARE INSTRUCTIONS  For the next few days, avoid physical activities that bring on chest pain. Continue physical activities as directed.  Do not smoke cigarettes or drink alcohol until your symptoms are gone. If you do smoke, it is time to quit. You may receive instructions and counseling on how to stop smoking. Only take over-the-counter or prescription medicine for pain, discomfort, or fever as directed by your caregiver.  Follow your caregiver's suggestions for further testing if your chest pain does not go away.  Keep any follow-up appointments you made. If you do not go to an appointment, you could develop lasting (chronic) problems with pain. If there is any problem keeping an appointment, you must call to reschedule.  SEEK MEDICAL CARE IF:  You think you are having problems from the medicine you are taking. Read your medicine instructions carefully.  Your chest pain does not go away, even after treatment.  You develop a rash with blisters on your chest.  SEEK IMMEDIATE MEDICAL CARE IF:  You have increased chest pain or pain that spreads to your arm, neck, jaw, back, or belly (abdomen).  You develop shortness of breath, an increasing cough, or you are coughing up blood.  You have severe back or abdominal pain, feel sick to your stomach (nauseous) or throw up (vomit).  You develop severe weakness, fainting, or chills.  You have an  oral temperature above 102 F (38.9 C), not controlled by medicine.  THIS IS AN EMERGENCY. Do not wait to see if the pain will go away. Get medical help at once. Call your local emergency services (911 in [place] Do not drive yourself to the hospital. Additional Information:  Your vital signs today were: BP 125/83    Pulse 69    Temp (!) 97.5 F (36.4 C) (Oral)    Resp 12    Ht 5' (1.524 m)    Wt 86.2 kg (190 lb)    SpO2 100%    BMI 37.11 kg/m  If your blood pressure (BP) was elevated above 135/85 this visit, please have this repeated by your doctor within one month. ---------------

## 2017-07-07 ENCOUNTER — Other Ambulatory Visit: Payer: Self-pay

## 2017-07-07 ENCOUNTER — Emergency Department (HOSPITAL_COMMUNITY): Payer: BLUE CROSS/BLUE SHIELD

## 2017-07-07 ENCOUNTER — Encounter (HOSPITAL_COMMUNITY): Payer: Self-pay

## 2017-07-07 ENCOUNTER — Emergency Department (HOSPITAL_COMMUNITY)
Admission: EM | Admit: 2017-07-07 | Discharge: 2017-07-08 | Disposition: A | Discharge status: Home / Self Care | Payer: BLUE CROSS/BLUE SHIELD | Attending: Emergency Medicine | Admitting: Emergency Medicine

## 2017-07-07 DIAGNOSIS — R079 Chest pain, unspecified: Secondary | ICD-10-CM | POA: Diagnosis not present

## 2017-07-07 DIAGNOSIS — Z79899 Other long term (current) drug therapy: Secondary | ICD-10-CM | POA: Insufficient documentation

## 2017-07-07 LAB — BASIC METABOLIC PANEL
ANION GAP: 12 (ref 5–15)
BUN: 9 mg/dL (ref 6–20)
CALCIUM: 9.3 mg/dL (ref 8.9–10.3)
CHLORIDE: 102 mmol/L (ref 101–111)
CO2: 22 mmol/L (ref 22–32)
Creatinine, Ser: 0.73 mg/dL (ref 0.44–1.00)
GFR calc non Af Amer: >60 mL/min (ref >60)
Glucose, Bld: 92 mg/dL (ref 65–99)
Potassium: 3.8 mmol/L (ref 3.5–5.1)
SODIUM: 136 mmol/L (ref 135–145)

## 2017-07-07 LAB — I-STAT TROPONIN, ED
TROPONIN I, POC: 0 ng/mL (ref 0.00–0.08)
Troponin i, poc: 0 ng/mL (ref 0.00–0.08)

## 2017-07-07 LAB — I-STAT BETA HCG BLOOD, ED (MC, WL, AP ONLY)

## 2017-07-07 LAB — CBC
HCT: 41 % (ref 36.0–46.0)
HEMOGLOBIN: 13.7 g/dL (ref 12.0–15.0)
MCH: 29 pg (ref 26.0–34.0)
MCHC: 33.4 g/dL (ref 30.0–36.0)
MCV: 86.9 fL (ref 78.0–100.0)
PLATELETS: 199 10*3/uL (ref 150–400)
RBC: 4.72 MIL/uL (ref 3.87–5.11)
RDW: 13.6 % (ref 11.5–15.5)
WBC: 10.6 10*3/uL — AB (ref 4.0–10.5)

## 2017-07-07 LAB — D-DIMER, QUANTITATIVE (NOT AT ARMC): D DIMER QUANT: 0.45 ug{FEU}/mL (ref 0.00–0.50)

## 2017-07-07 NOTE — ED Provider Notes (Signed)
[facility] Provider Note   CSN: [id number] Arrival date & time: 07/07/17  1621     History   Chief Complaint Chief Complaint  Patient presents with  . Chest Pain    HPI Hannah Morton is a 27 y.o. female with a hx of major medical problems presents to the Emergency Department complaining of intermittent central chest pain that radiates to the right shoulder onset 4 days ago.  Patient reports the pain is sharp and lasts 30 seconds to 10 minutes.  It is not associated with diaphoresis, nausea, lightheadedness, dizziness, tingling in her hands and feet, flushing.  She reports that it happens every several hours.  No aggravating or alleviating factors.  She reports that she cannot elicit the pain on her own.  She denies feeling anxious.  Patient denies personal cardiac history.  She denies family history of early cardiac death.  She denies smoking or regular alcohol usage.  Patient also denies drug usage.  She denies significant caffeine usage.  Patient denies recent travel, leg swelling, calf tenderness, history of lupus, recent surgeries, previous history of DVT.  She denies palpitations or hemoptysis.  No recent URI or illness.  The history is provided by the patient and medical records. No language interpreter was used.    History reviewed. No pertinent past medical history.  Patient Active Problem List   Diagnosis Date Noted  . Active labor 09/04/2013  . Normal delivery 09/04/2013    History reviewed. No pertinent surgical history.   OB History    Gravida  3   Para  2   Term  2   Preterm      AB  1   Living  2     SAB      TAB  1   Ectopic      Multiple      Live Births  2            Home Medications    Prior to Admission medications   Medication Sig Start Date End Date Taking? Authorizing Provider  CHLOROPHYLL PO Take 1 tablet by mouth daily.   Yes [provider]  loratadine (CLARITIN) 10 MG  tablet Take 10 mg by mouth daily as needed for allergies.   Yes [provider]  Multiple Vitamin (MULTIVITAMIN WITH MINERALS) TABS tablet Take 1 tablet by mouth daily.   Yes [provider]  Propylene Glycol-Glycerin (SOOTHE) 0.6-0.6 % SOLN Place 2 drops into both eyes daily.   Yes [provider]  cyclobenzaprine (FLEXERIL) 5 MG tablet Take 1 tablet (5 mg total) by mouth 3 (three) times daily as needed for muscle spasms. Patient not taking: Reported on 03/28/2017 08/17/15   Collene Gobbleaub, Steven A, MD  ibuprofen (ADVIL,MOTRIN) 600 MG tablet Take 1 tablet (600 mg total) by mouth every 6 (six) hours as needed. Patient taking differently: Take 600 mg by mouth every 6 (six) hours as needed for moderate pain.  08/13/15   Lyndal PulleyKnott, Daniel, MD  ranitidine (ZANTAC) 150 MG tablet Take 1 tablet (150 mg total) by mouth 2 (two) times daily. 07/08/17   Manoj Enriquez, Dahlia ClientHannah, PA-C  trimethoprim-polymyxin b (POLYTRIM) ophthalmic solution Place 1 drop into the left eye every 4 (four) hours. Patient not taking: Reported on 03/28/2017 03/10/16   Linna HoffKindl, James D, MD    Family History No family history on file.  Social History Social History   Tobacco Use  . Smoking status: Never Smoker  . Smokeless tobacco:  Never Used  . Tobacco comment: Only when drinking  Substance Use Topics  . Alcohol use: Yes    Alcohol/week: 1.2 oz    Types: 2 Standard drinks or equivalent per week  . Drug use: No     Allergies   Codeine   Review of Systems Review of Systems  Constitutional: Negative for appetite change, diaphoresis, fatigue, fever and unexpected weight change.  HENT: Negative for mouth sores.   Eyes: Negative for visual disturbance.  Respiratory: Negative for cough, chest tightness, shortness of breath and wheezing.   Cardiovascular: Positive for chest pain.  Gastrointestinal: Negative for abdominal pain, constipation, diarrhea, nausea and vomiting.  Endocrine: Negative for polydipsia,  polyphagia and polyuria.  Genitourinary: Negative for dysuria, frequency, hematuria and urgency.  Musculoskeletal: Negative for back pain and neck stiffness.  Skin: Negative for rash.  Allergic/Immunologic: Negative for immunocompromised state.  Neurological: Negative for syncope, light-headedness and headaches.  Hematological: Does not bruise/bleed easily.  Psychiatric/Behavioral: Negative for sleep disturbance. The patient is not nervous/anxious.      Physical Exam Updated Vital Signs BP 114/78   Pulse 64   Temp 97.8 F (36.6 C) (Oral)   Resp 15   LMP 06/19/2017   SpO2 98%   Physical Exam  Constitutional: She appears well-developed and well-nourished. No distress.  Awake, alert, nontoxic appearance  HENT:  Head: Normocephalic and atraumatic.  Mouth/Throat: Oropharynx is clear and moist. No oropharyngeal exudate.  Eyes: Conjunctivae are normal. No scleral icterus.  Neck: Normal range of motion. Neck supple.  Cardiovascular: Normal rate, regular rhythm and intact distal pulses.  Pulmonary/Chest: Effort normal and breath sounds normal. No respiratory distress. She has no wheezes.  Equal chest expansion  Abdominal: Soft. Bowel sounds are normal. She exhibits no mass. There is no tenderness. There is no rebound and no guarding.  Musculoskeletal: Normal range of motion. She exhibits no edema.       Right lower leg: She exhibits no edema.  No calf tenderness or palpable cord  Neurological: She is alert.  Speech is clear and goal oriented Moves extremities without ataxia  Skin: Skin is warm and dry. She is not diaphoretic.  Psychiatric: She has a normal mood and affect.  Nursing note and vitals reviewed.    ED Treatments / Results  Labs (all labs ordered are listed, but only abnormal results are displayed) Labs Reviewed  CBC - Abnormal; Notable for the following components:      Result Value   WBC 10.6 (*)    All other components within normal limits  BASIC METABOLIC  PANEL  D-DIMER, QUANTITATIVE (NOT AT [facility])  I-STAT TROPONIN, ED  I-STAT BETA HCG BLOOD, ED ([facility], [facility], [facility] ONLY)  I-STAT TROPONIN, ED    EKG EKG Interpretation  Date/Time:  Friday July 07 2017 16:31:26 EDT Ventricular Rate:  119 PR Interval:  138 QRS Duration: 68 QT Interval:  318 QTC Calculation: 447 R Axis:   54 Text Interpretation:  Sinus tachycardia Otherwise normal ECG When comapred to prior, faster rate.  No STEMI Confirmed by Theda Belfast ([id number]) on 07/08/2017 12:04:18 AM     Radiology Dg Chest 2 View  Result Date: 07/07/2017 CLINICAL DATA:  Chest pain EXAM: CHEST - 2 VIEW COMPARISON:  03/28/2017 FINDINGS: The heart size and mediastinal contours are within normal limits. Both lungs are clear. The visualized skeletal structures are unremarkable. IMPRESSION: No active cardiopulmonary disease. Electronically Signed   By: Jasmine Pang M.D.   On: 07/07/2017 18:04    Procedures  Procedures (including critical care time)  Medications Ordered in ED Medications - No data to display   Initial Impression / Assessment and Plan / ED Course  I have reviewed the triage vital signs and the nursing notes.  Pertinent labs & imaging results that were available during my care of the patient were reviewed by me and considered in my medical decision making (see chart for details).     Patient presents with central chest pain that radiates to her right shoulder.  Labs are reassuring.  Troponin initial and repeat is negative.  Patient is not pregnant.  Mild leukocytosis of 10.6 noted.  This is nonspecific.  She has no infectious symptoms.  She is afebrile.  No evidence of myocarditis, acute coronary syndrome.  Her chest x-ray is without evidence of pneumonia or pneumothorax.  Personally evaluated these images.  EKG is tachycardic but is nonischemic.  She is not hypokalemic.  D-dimer is negative.  Patient is low risk.  Doubt DVT/PE.  Patient has close follow-up with her primary care provider  next week.  I have recommended that she continue with this appointment.  Discussed this with patient.  She states understanding and is in agreement with the plan.  Final Clinical Impressions(s) / ED Diagnoses   Final diagnoses:  Central chest pain    ED Discharge Orders        Ordered    ranitidine (ZANTAC) 150 MG tablet  2 times daily     07/08/17 0002       Lilyrose Tanney, Boyd Kerbs 07/08/17 0004    Tegeler, Canary Brim, MD 07/08/17 [phone]

## 2017-07-07 NOTE — ED Triage Notes (Signed)
PT arrives to ED with complaints of central CP that radiates to right arm. Pt states it began on Tuesday and comes and goes. Endorses dizziness that led up to the pain. PT tearful in triage and reports she is scared and anxious.

## 2017-07-08 MED ORDER — RANITIDINE HCL 150 MG PO TABS: 150.0000 mg | ORAL_TABLET | Freq: Two times a day (BID) | ORAL | 0 refills | Status: DC

## 2017-07-08 NOTE — Discharge Instructions (Addendum)
1. Medications: Zantac, usual home medications 2. Treatment: rest, drink plenty of fluids,  3. Follow Up: Please followup with your primary doctor in at your scheduled appointment for discussion of your diagnoses and further evaluation after today's visit; if you do not have a primary care doctor use the resource guide provided to find one; Please return to the ER for chest pain that is worsening, associated with syncope, high fevers, difficulty breathing or other concerns.

## 2017-07-13 ENCOUNTER — Ambulatory Visit: Payer: BLUE CROSS/BLUE SHIELD | Admitting: Family Medicine

## 2017-07-13 ENCOUNTER — Encounter: Payer: Self-pay | Admitting: Family Medicine

## 2017-07-13 VITALS — BP 110/70 | HR 97 | Ht 60.0 in | Wt 198.5 lb

## 2017-07-13 DIAGNOSIS — M25562 Pain in left knee: Secondary | ICD-10-CM

## 2017-07-13 DIAGNOSIS — M25561 Pain in right knee: Secondary | ICD-10-CM | POA: Insufficient documentation

## 2017-07-13 DIAGNOSIS — F439 Reaction to severe stress, unspecified: Secondary | ICD-10-CM | POA: Diagnosis not present

## 2017-07-13 DIAGNOSIS — R079 Chest pain, unspecified: Secondary | ICD-10-CM | POA: Insufficient documentation

## 2017-07-13 DIAGNOSIS — R002 Palpitations: Secondary | ICD-10-CM | POA: Diagnosis not present

## 2017-07-13 NOTE — Progress Notes (Signed)
Subjective:  Patient ID: Hannah Morton, female    DOB: [birth year]  Age: 27 y.o. MRN: [id number]  CC: [place] presents for follow-up of recent ER visit for chest pain and shortness of breath.  EKG taken with her chest pain showed sinus tachycardia.  Serial troponin troponins were negative.  Chest x-ray was negative.  D-dimer test was normal.  CBC showed mild leukocytosis.  She tells me that she is actually had more than one ER visit over the last several months for the same issue.  There are stressors in this patient's life.  She is married and has 2 children ages 74 and 6.  Her 31-year-old is on the spectrum.  She suspect that suspects that her 2-year-old will be on the spectrum somewhere as well.  She works in Education officer, environmental health inpatient as a Geologist, engineering.  She does not smoke or use illicit drugs.  She has 2 alcoholic drinks weekly.  She does enjoy her caffeine.  Her mother is 65 and has COPD continues to smoke and other health issues.  Her father is 77 and in relatively good health.  She also has multiple joint aches and pains to include both of her knees.  She specifically asked me to check ESR.  Her last Pap smear was a little bit over a year ago.  Outpatient Medications Prior to Visit  Medication Sig Dispense Refill  . loratadine (CLARITIN) 10 MG tablet Take 10 mg by mouth daily as needed for allergies.    . ranitidine (ZANTAC) 150 MG tablet Take 1 tablet (150 mg total) by mouth 2 (two) times daily. (Patient not taking: Reported on 07/13/2017) 60 tablet 0  . CHLOROPHYLL PO Take 1 tablet by mouth daily.    . cyclobenzaprine (FLEXERIL) 5 MG tablet Take 1 tablet (5 mg total) by mouth 3 (three) times daily as needed for muscle spasms. (Patient not taking: Reported on 03/28/2017) 30 tablet 1  . ibuprofen (ADVIL,MOTRIN) 600 MG tablet Take 1 tablet (600 mg total) by mouth every 6 (six) hours as needed. (Patient taking differently: Take 600 mg by mouth every 6 (six)  hours as needed for moderate pain. ) 30 tablet 0  . Multiple Vitamin (MULTIVITAMIN WITH MINERALS) TABS tablet Take 1 tablet by mouth daily.    Marland Kitchen Propylene Glycol-Glycerin (SOOTHE) 0.6-0.6 % SOLN Place 2 drops into both eyes daily.    Marland Kitchen trimethoprim-polymyxin b (POLYTRIM) ophthalmic solution Place 1 drop into the left eye every 4 (four) hours. (Patient not taking: Reported on 03/28/2017) 10 mL 0   No facility-administered medications prior to visit.     ROS Review of Systems  Constitutional: Negative for chills, fever and unexpected weight change.  HENT: Negative.   Eyes: Negative for photophobia and visual disturbance.  Respiratory: Negative for chest tightness, shortness of breath and wheezing.   Cardiovascular: Positive for palpitations.  Gastrointestinal: Negative.   Endocrine: Negative for cold intolerance and heat intolerance.  Genitourinary: Negative.   Musculoskeletal: Positive for arthralgias. Negative for gait problem.  Skin: Negative.   Allergic/Immunologic: Negative for immunocompromised state.  Neurological: Negative for weakness and headaches.  Hematological: Does not bruise/bleed easily.  Psychiatric/Behavioral: Negative for self-injury and suicidal ideas. The patient is nervous/anxious.     Objective:  BP 110/70   Pulse 97   Ht 5' (1.524 m)   Wt 198 lb 8 oz (90 kg)   LMP 06/19/2017   SpO2 98%   BMI 38.77 kg/m  BP Readings from Last 3 Encounters:  07/13/17 110/70  07/08/17 106/65  03/28/17 130/79    Wt Readings from Last 3 Encounters:  07/13/17 198 lb 8 oz (90 kg)  03/28/17 190 lb (86.2 kg)  01/29/17 189 lb (85.7 kg)    Physical Exam  Constitutional: She is oriented to person, place, and time. She appears well-developed and well-nourished. No distress.  HENT:  Head: Normocephalic and atraumatic.  Right Ear: External ear normal.  Left Ear: External ear normal.  Nose: Nose normal.  Mouth/Throat: Oropharynx is clear and moist. No oropharyngeal  exudate.  Eyes: Pupils are equal, round, and reactive to light. Conjunctivae and EOM are normal. Right eye exhibits no discharge. Left eye exhibits no discharge. No scleral icterus.  Neck: Normal range of motion. Neck supple. No JVD present. No tracheal deviation present. No thyromegaly present.  Cardiovascular: Normal rate, regular rhythm and normal heart sounds.  Pulmonary/Chest: Effort normal and breath sounds normal.  Neurological: She is alert and oriented to person, place, and time.  Skin: Skin is warm and dry. She is not diaphoretic. No erythema.  Psychiatric: She has a normal mood and affect. Her behavior is normal.   Depression screen [facility] 2/9 07/13/2017 08/14/2015  Decreased Interest 1 0  Down, Depressed, Hopeless 1 0  PHQ - 2 Score 2 0  Altered sleeping 3 -  Tired, decreased energy 3 -  Change in appetite 3 -  Feeling bad or failure about yourself  0 -  Trouble concentrating 1 -  Moving slowly or fidgety/restless 1 -  Suicidal thoughts 0 -  PHQ-9 Score 13 -    Lab Results  Component Value Date   WBC 10.6 (H) 07/07/2017   HGB 13.7 07/07/2017   HCT 41.0 07/07/2017   PLT 199 07/07/2017   GLUCOSE 92 07/07/2017   ALT 10 03/03/2014   AST 15 03/03/2014   NA 136 07/07/2017   K 3.8 07/07/2017   CL 102 07/07/2017   CREATININE 0.73 07/07/2017   BUN 9 07/07/2017   CO2 22 07/07/2017    Dg Chest 2 View  Result Date: 07/07/2017 CLINICAL DATA:  Chest pain EXAM: CHEST - 2 VIEW COMPARISON:  03/28/2017 FINDINGS: The heart size and mediastinal contours are within normal limits. Both lungs are clear. The visualized skeletal structures are unremarkable. IMPRESSION: No active cardiopulmonary disease. Electronically Signed   By: Donavan Foil M.D.   On: 07/07/2017 18:04    Assessment & Plan:   Hannah Morton was seen today for establish care.  Diagnoses and all orders for this visit:  Chest pain, unspecified type -     CBC; Future -     Comprehensive metabolic panel; Future -     Lipid  panel; Future -     TSH; Future -     Urinalysis, Routine w reflex microscopic; Future -     Ambulatory referral to Cardiology -     HIV antibody; Future  Arthralgia of both knees -     Sedimentation rate; Future -     HIV antibody; Future  Stress  Palpitations -     TSH; Future -     Ambulatory referral to Cardiology   I have discontinued Hannah Morton N. Manzi's ibuprofen, cyclobenzaprine, trimethoprim-polymyxin b, multivitamin with minerals, CHLOROPHYLL PO, and Propylene Glycol-Glycerin. I am also having her maintain her loratadine and ranitidine.  No orders of the defined types were placed in this encounter.  I believe that it is highly probable that large component of patient's  symptoms are due to stress.  We will send her to cardiology for possible Holter monitor placement.  She will she will return for fasting lab work.  And expecting her lab results to be reassuring.  Discussed the possibility of SSRI therapy for her significant PHQ 9 score.  Follow-up: Return in about 1 month (around 08/13/2017).  Libby Maw, MD

## 2017-07-13 NOTE — Patient Instructions (Signed)
Cardiac Event Monitoring A cardiac event monitor is a small recording device that is used to detect abnormal heart rhythms (arrhythmias). The monitor is used to record your heart rhythm when you have symptoms, such as:  Fast heartbeats (palpitations), such as heart racing or fluttering.  Dizziness.  Fainting or light-headedness.  Unexplained weakness.  Some monitors are wired to electrodes placed on your chest. Electrodes are flat, sticky disks that attach to your skin. Other monitors may be hand-held or worn on the wrist. The monitor can be worn for up to 30 days. If the monitor is attached to your chest, a technician will prepare your chest for the electrode placement and show you how to work the monitor. Take time to practice using the monitor before you leave the office. Make sure you understand how to send the information from the monitor to your health care provider. In some cases, you may need to use a landline telephone instead of a cell phone. What are the risks? Generally, this device is safe to use, but it possible that the skin under the electrodes will become irritated. How to use your cardiac event monitor  Wear your monitor at all times, except when you are in water: ? Do not let the monitor get wet. ? Take the monitor off when you bathe. Do not swim or use a hot tub with it on.  Keep your skin clean. Do not put body lotion or moisturizer on your chest.  Change the electrodes as told by your health care provider or any time they stop sticking to your skin. You may need to use medical tape to keep them on.  Try to put the electrodes in slightly different places on your chest to help prevent skin irritation. They must remain in the area under your left breast and in the upper right section of your chest.  Make sure the monitor is safely clipped to your clothing or in a location close to your body that your health care provider recommends.  Press the button to record as soon  as you feel heart-related symptoms, such as: ? Dizziness. ? Weakness. ? Light-headedness. ? Palpitations. ? Thumping or pounding in your chest. ? Shortness of breath. ? Unexplained weakness.  Keep a diary of your activities, such as walking, doing chores, and taking medicine. It is very important to note what you were doing when you pushed the button to record your symptoms. This will help your health care provider determine what might be contributing to your symptoms.  Send the recorded information as recommended by your health care provider. It may take some time for your health care provider to process the results.  Change the batteries as told by your health care provider.  Keep electronic devices away from your monitor. This includes: ? Tablets. ? MP3 players. ? Cell phones.  While wearing your monitor you should avoid: ? Electric blankets. ? Armed forces operational officer. ? Electric toothbrushes. ? Microwave ovens. ? Magnets. ? Metal detectors. Get help right away if:  You have chest pain.  You have extreme difficulty breathing or shortness of breath.  You develop a very fast heartbeat that persists.  You develop dizziness that does not go away.  You faint or constantly feel like you are about to faint. Summary  A cardiac event monitor is a small recording device that is used to help detect abnormal heart rhythms (arrhythmias).  The monitor is used to record your heart rhythm when you have heart-related symptoms.  Make  sure you understand how to send the information from the monitor to your health care provider.  It is important to press the button on the monitor when you have any heart-related symptoms.  Keep a diary of your activities, such as walking, doing chores, and taking medicine. It is very important to note what you were doing when you pushed the button to record your symptoms. This will help your health care provider learn what might be causing your symptoms. This  information is not intended to replace advice given to you by your health care provider. Make sure you discuss any questions you have with your health care provider. Document Released: 12/08/2007 Document Revised: 02/13/2016 Document Reviewed: 02/13/2016 Elsevier Interactive Patient Education  2017 [employer].  Palpitations A palpitation is the feeling that your heartbeat is irregular or is faster than normal. It may feel like your heart is fluttering or skipping a beat. Palpitations are usually not a serious problem. They may be caused by many things, including smoking, caffeine, alcohol, stress, and certain medicines. Although most causes of palpitations are not serious, palpitations can be a sign of a serious medical problem. In some cases, you may need further medical evaluation. Follow these instructions at home: Pay attention to any changes in your symptoms. Take these actions to help with your condition:  Avoid the following: ? Caffeinated coffee, tea, soft drinks, diet pills, and energy drinks. ? Chocolate. ? Alcohol.  Do not use any tobacco products, such as cigarettes, chewing tobacco, and e-cigarettes. If you need help quitting, ask your health care provider.  Try to reduce your stress and anxiety. Things that can help you relax include: ? Yoga. ? Meditation. ? Physical activity, such as swimming, jogging, or walking. ? Biofeedback. This is a method that helps you learn to use your mind to control things in your body, such as your heartbeats.  Get plenty of rest and sleep.  Take over-the-counter and prescription medicines only as told by your health care provider.  Keep all follow-up visits as told by your health care provider. This is important.  Contact a health care provider if:  You continue to have a fast or irregular heartbeat after 24 hours.  Your palpitations occur more often. Get help right away if:  You have chest pain or shortness of breath.  You have a  severe headache.  You feel dizzy or you faint. This information is not intended to replace advice given to you by your health care provider. Make sure you discuss any questions you have with your health care provider. Document Released: 02/26/2000 Document Revised: 08/03/2015 Document Reviewed: 11/13/2014 Elsevier Interactive Patient Education  2018 [place] and Stress Management Stress is a normal reaction to life events. It is what you feel when life demands more than you are used to or more than you can handle. Some stress can be useful. For example, the stress reaction can help you catch the last bus of the day, study for a test, or meet a deadline at work. But stress that occurs too often or for too long can cause problems. It can affect your emotional health and interfere with relationships and normal daily activities. Too much stress can weaken your immune system and increase your risk for physical illness. If you already have a medical problem, stress can make it worse. What are the causes? All sorts of life events may cause stress. An event that causes stress for one person may not be stressful for  another person. Major life events commonly cause stress. These may be positive or negative. Examples include losing your job, moving into a new home, getting married, having a baby, or losing a loved one. Less obvious life events may also cause stress, especially if they occur day after day or in combination. Examples include working long hours, driving in traffic, caring for children, being in debt, or being in a difficult relationship. What are the signs or symptoms? Stress may cause emotional symptoms including, the following:  Anxiety. This is feeling worried, afraid, on edge, overwhelmed, or out of control.  Anger. This is feeling irritated or impatient.  Depression. This is feeling sad, down, helpless, or guilty.  Difficulty focusing, remembering, or making  decisions.  Stress may cause physical symptoms, including the following:  Aches and pains. These may affect your head, neck, back, stomach, or other areas of your body.  Tight muscles or clenched jaw.  Low energy or trouble sleeping.  Stress may cause unhealthy behaviors, including the following:  Eating to feel better (overeating) or skipping meals.  Sleeping too little, too much, or both.  Working too much or putting off tasks (procrastination).  Smoking, drinking alcohol, or using drugs to feel better.  How is this diagnosed? Stress is diagnosed through an assessment by your health care provider. Your health care provider will ask questions about your symptoms and any stressful life events.Your health care provider will also ask about your medical history and may order blood tests or other tests. Certain medical conditions and medicine can cause physical symptoms similar to stress. Mental illness can cause emotional symptoms and unhealthy behaviors similar to stress. Your health care provider may refer you to a mental health professional for further evaluation. How is this treated? Stress management is the recommended treatment for stress.The goals of stress management are reducing stressful life events and coping with stress in healthy ways. Techniques for reducing stressful life events include the following:  Stress identification. Self-monitor for stress and identify what causes stress for you. These skills may help you to avoid some stressful events.  Time management. Set your priorities, keep a calendar of events, and learn to say "no." These tools can help you avoid making too many commitments.  Techniques for coping with stress include the following:  Rethinking the problem. Try to think realistically about stressful events rather than ignoring them or overreacting. Try to find the positives in a stressful situation rather than focusing on the negatives.  Exercise.  Physical exercise can release both physical and emotional tension. The key is to find a form of exercise you enjoy and do it regularly.  Relaxation techniques. These relax the body and mind. Examples include yoga, meditation, tai chi, biofeedback, deep breathing, progressive muscle relaxation, listening to music, being out in nature, journaling, and other hobbies. Again, the key is to find one or more that you enjoy and can do regularly.  Healthy lifestyle. Eat a balanced diet, get plenty of sleep, and do not smoke. Avoid using alcohol or drugs to relax.  Strong support network. Spend time with family, friends, or other people you enjoy being around.Express your feelings and talk things over with someone you trust.  Counseling or talktherapy with a mental health professional may be helpful if you are having difficulty managing stress on your own. Medicine is typically not recommended for the treatment of stress.Talk to your health care provider if you think you need medicine for symptoms of stress. Follow these instructions at home:  Keep all follow-up visits as directed by your health care provider.  Take all medicines as directed by your health care provider. Contact a health care provider if:  Your symptoms get worse or you start having new symptoms.  You feel overwhelmed by your problems and can no longer manage them on your own. Get help right away if:  You feel like hurting yourself or someone else. This information is not intended to replace advice given to you by your health care provider. Make sure you discuss any questions you have with your health care provider. Document Released: 08/24/2000 Document Revised: 08/06/2015 Document Reviewed: 10/23/2012 Elsevier Interactive Patient Education  2017 [employer].

## 2017-07-14 ENCOUNTER — Other Ambulatory Visit (INDEPENDENT_AMBULATORY_CARE_PROVIDER_SITE_OTHER): Payer: BLUE CROSS/BLUE SHIELD

## 2017-07-14 DIAGNOSIS — R079 Chest pain, unspecified: Secondary | ICD-10-CM | POA: Diagnosis not present

## 2017-07-14 DIAGNOSIS — M25562 Pain in left knee: Secondary | ICD-10-CM | POA: Diagnosis not present

## 2017-07-14 DIAGNOSIS — M25561 Pain in right knee: Secondary | ICD-10-CM | POA: Diagnosis not present

## 2017-07-14 DIAGNOSIS — R002 Palpitations: Secondary | ICD-10-CM | POA: Diagnosis not present

## 2017-07-14 LAB — URINALYSIS, ROUTINE W REFLEX MICROSCOPIC
BILIRUBIN URINE: NEGATIVE
Ketones, ur: NEGATIVE
NITRITE: NEGATIVE
Specific Gravity, Urine: 1.02 (ref 1.000–1.030)
TOTAL PROTEIN, URINE-UPE24: NEGATIVE
URINE GLUCOSE: NEGATIVE
Urobilinogen, UA: 0.2 (ref 0.0–1.0)
pH: 6 (ref 5.0–8.0)

## 2017-07-14 LAB — COMPREHENSIVE METABOLIC PANEL
ALT: 10 U/L (ref 0–35)
AST: 12 U/L (ref 0–37)
Albumin: 4.1 g/dL (ref 3.5–5.2)
Alkaline Phosphatase: 45 U/L (ref 39–117)
BILIRUBIN TOTAL: 0.4 mg/dL (ref 0.2–1.2)
BUN: 13 mg/dL (ref 6–23)
CO2: 25 meq/L (ref 19–32)
Calcium: 9.1 mg/dL (ref 8.4–10.5)
Chloride: 105 mEq/L (ref 96–112)
Creatinine, Ser: 0.62 mg/dL (ref 0.40–1.20)
GFR: 121.38 mL/min (ref >60.00)
Glucose, Bld: 91 mg/dL (ref 70–99)
Potassium: 4.2 mEq/L (ref 3.5–5.1)
Sodium: 137 mEq/L (ref 135–145)
Total Protein: 7 g/dL (ref 6.0–8.3)

## 2017-07-14 LAB — SEDIMENTATION RATE: Sed Rate: 13 mm/hr (ref 0–20)

## 2017-07-14 LAB — LIPID PANEL
CHOL/HDL RATIO: 3
Cholesterol: 135 mg/dL (ref 0–200)
HDL: 47.5 mg/dL (ref >39.00)
LDL Cholesterol: 73 mg/dL (ref 0–99)
NONHDL: 87.82
Triglycerides: 75 mg/dL (ref 0.0–149.0)
VLDL: 15 mg/dL (ref 0.0–40.0)

## 2017-07-14 LAB — CBC
HCT: 39.7 % (ref 36.0–46.0)
Hemoglobin: 13.2 g/dL (ref 12.0–15.0)
MCHC: 33.2 g/dL (ref 30.0–36.0)
MCV: 87.6 fl (ref 78.0–100.0)
PLATELETS: 221 10*3/uL (ref 150.0–400.0)
RBC: 4.53 Mil/uL (ref 3.87–5.11)
RDW: 14.1 % (ref 11.5–15.5)
WBC: 7.4 10*3/uL (ref 4.0–10.5)

## 2017-07-14 LAB — TSH: TSH: 0.6 u[IU]/mL (ref 0.35–4.50)

## 2017-07-15 LAB — HIV ANTIBODY (ROUTINE TESTING W REFLEX): HIV 1&2 Ab, 4th Generation: NONREACTIVE

## 2017-07-17 ENCOUNTER — Encounter: Payer: Self-pay | Admitting: Family Medicine

## 2017-07-20 ENCOUNTER — Encounter: Payer: Self-pay | Admitting: Cardiology

## 2017-07-20 ENCOUNTER — Ambulatory Visit: Payer: BLUE CROSS/BLUE SHIELD | Admitting: Cardiology

## 2017-07-20 VITALS — BP 128/84 | HR 70 | Ht 60.0 in | Wt 199.0 lb

## 2017-07-20 DIAGNOSIS — F439 Reaction to severe stress, unspecified: Secondary | ICD-10-CM | POA: Diagnosis not present

## 2017-07-20 DIAGNOSIS — R002 Palpitations: Secondary | ICD-10-CM

## 2017-07-20 DIAGNOSIS — R079 Chest pain, unspecified: Secondary | ICD-10-CM

## 2017-07-20 NOTE — Patient Instructions (Signed)
MEDICATION INSTRUCTIONS  NO CHANGES     SCHEDULE AT [address] Your physician has recommended that you wear an event monitor 30 DAYS. Event monitors are medical devices that record the heart's electrical activity. Doctors most often [place] these monitors to diagnose arrhythmias. Arrhythmias are problems with the speed or rhythm of the heartbeat. The monitor is a small, portable device. You can wear one while you do your normal daily activities. This is usually used to diagnose what is causing palpitations/syncope (passing out).    SCHEDULE AT [address] Your physician has requested that you have an exercise tolerance test. For further information please visit [email or web address] Please also follow instruction sheet, as given.    Your physician recommends that you schedule a follow-up appointment in 2 MONTHS WITH DR HARDING.

## 2017-07-20 NOTE — Progress Notes (Signed)
PCP: Hannah Sax, MD  Clinic Note: Chief Complaint  Patient presents with  . New Patient (Initial Visit)    Chest pain, palpitations    HPI: Hannah Morton is a 27 y.o. female who is being seen today for the evaluation of "Chest Pain with Shortness of Breath" & Palpitations at the request of Hannah Morton, Hannah Morton was seen by Dr. Doreene Burke on Jul 13, 2017 as part of a ER visit follow-up  Recent Hospitalizations:   Emergency room visits on January 29, 2017, March 28, 2017 and July 07, 2017 all chest pain and palpitations.  Studies Personally Reviewed - (if available, images/films reviewed: From Epic Chart or Care Everywhere) None  Interval History: Hannah Morton is a very pleasant young woman who has had several evaluations in the emergency room for chest pain that she says  can occur regardless of what she is doing, but often associated with having had coughing spells.  She describes it as a "radiating pain joint from the center of her chest out both sides.  She feels on both sides of the sternum.  She really does not think it makes any difference if she is walking or at rest.  The spells can last anywhere from 5 to 10 minutes, but can happen several times over the course of the day and then go several weeks without having them.  She says they can occur she is walking, but did not get worse when she walks.  She also has another set of symptoms that are not associated with chest discomfort that are more related to palpitation type symptoms.  The initial sensation that she feels are intermittent palpitations with fast skipping beats lasting less than a few minutes that can make her feel dizzy.  Usually they are self-limiting and only lasts less than and she has not had any syncope or near syncope. The other symptoms she feels a "whooshing sensation" which is a big almost falling sensation  as though her heart is stopping followed by pounding beat.   These do not happen that often and are not associated with the palpitations.  She does not feel any palpitations before.  Otherwise, she is pretty stable from a cardiac viewpoint without any PND, orthopnea or edema.  No TIA or amaurosis fugax.  No exertional or resting dyspnea.  No claudication.    She tells me that she drinks about 3 to 4 cups of coffee a day.  ROS: A comprehensive was performed. Review of Systems  Constitutional: Negative for chills, fever and malaise/fatigue.  HENT: Negative for nosebleeds.   Respiratory: Negative for cough, sputum production and wheezing.   Gastrointestinal: Negative for abdominal pain, blood in stool and melena.  Genitourinary: Negative for hematuria.  Musculoskeletal: Negative for falls and joint pain.  Neurological: Negative for focal weakness and weakness.  Psychiatric/Behavioral: The patient is nervous/anxious (She gets very concerned about these chest pain episodes.  More so than the palpitations).    I have reviewed and (if needed) personally updated the patient's problem list, medications, allergies, past medical and surgical history, social and family history.   Past Medical History:  Diagnosis Date  . Frequent headaches   . Migraines   . Obesity (BMI 35.0-39.9 without comorbidity)     History reviewed. No pertinent surgical history.  Current Meds  Medication Sig  . loratadine (CLARITIN) 10 MG tablet Take 10 mg by mouth daily as needed for allergies.    Allergies  Allergen Reactions  .  Codeine Rash    Social History   Tobacco Use  . Smoking status: Never Smoker  . Smokeless tobacco: Never Used  . Tobacco comment: Only when drinking  Substance Use Topics  . Alcohol use: Yes    Alcohol/week: 1.2 oz    Types: 2 Standard drinks or equivalent per week  . Drug use: No   Social History   Social History Narrative   Patient is a married mother of 2 sons.   She works as a Lawyer.     She may have 1-2 alcoholic drinks a week,  but never smoked.   She does try to go walking with her sons on occasion, but does not do routine exercise    family history includes Alcohol abuse in her brother and mother; Arthritis in her maternal grandmother; Asthma in her maternal grandmother; Birth defects in her maternal grandmother; COPD in her maternal grandmother and mother; Cancer in her maternal grandmother; Diabetes in her father and maternal grandmother; Heart attack in her paternal grandfather; Heart disease in her maternal grandmother; Hyperlipidemia in her maternal grandmother; Hypertension in her brother and mother; Stroke in her maternal grandfather and paternal grandfather.  Wt Readings from Last 3 Encounters:  07/20/17 199 lb (90.3 kg)  07/13/17 198 lb 8 oz (90 kg)  03/28/17 190 lb (86.2 kg)    PHYSICAL EXAM BP 128/84 (BP Location: Right Arm)   Pulse 70   Ht 5' (1.524 m)   Wt 199 lb (90.3 kg)   BMI 38.86 kg/m  Physical Exam  Constitutional: She is oriented to person, place, and time. She appears well-developed and well-nourished.  Borderline morbidly obese young woman in no acute distress.  Well-groomed.  HENT:  Head: Normocephalic and atraumatic.  Eyes: Pupils are equal, round, and reactive to light. Conjunctivae are normal.  Neck: Normal range of motion. Neck supple. No hepatojugular reflux and no JVD present. Carotid bruit is not present.  Cardiovascular: Normal rate, regular rhythm, S1 normal, S2 normal and intact distal pulses.  Occasional extrasystoles are present. PMI is not displaced (Unable to palpate). Exam reveals distant heart sounds and decreased pulses (Mostly because of body habitus.  Palpable). Exam reveals no gallop and no friction rub.  No murmur heard. Pulmonary/Chest: Effort normal and breath sounds normal. No respiratory distress. She has no wheezes. She has no rales. She exhibits tenderness (Notable reproducibility of her "radiating "chest pain with palpation mostly on the left second through  fourth ribs but also on the right.  Simply touching the area caused her to jump indicating that this is her intermittent recurrent symptom.).  Abdominal: Soft. Bowel sounds are normal. She exhibits no distension. There is no tenderness. There is no rebound.  Musculoskeletal: Normal range of motion. She exhibits edema (Trivial).  Neurological: She is alert and oriented to person, place, and time. No cranial nerve deficit.  Skin: Skin is warm and dry.  Psychiatric: She has a normal mood and affect. Her behavior is normal. Judgment and thought content normal.  Vitals reviewed.    Adult ECG Report  Rate: 70;  Rhythm: normal sinus rhythm, sinus arrhythmia and Otherwise normal axis, intervals and durations;   Narrative Interpretation: Relatively normal EKG   Other studies Reviewed: Additional studies/ records that were reviewed today include:  Recent Labs:   Lab Results  Component Value Date   CREATININE 0.62 07/14/2017   BUN 13 07/14/2017   NA 137 07/14/2017   K 4.2 07/14/2017   CL 105 07/14/2017   CO2  25 07/14/2017   Lab Results  Component Value Date   CHOL 135 07/14/2017   HDL 47.50 07/14/2017   LDLCALC 73 07/14/2017   TRIG 75.0 07/14/2017   CHOLHDL 3 07/14/2017    ASSESSMENT / PLAN: Problem List Items Addressed This Visit    Stress    I think stress and anxiety play a large role in the amount of concern she had with her chest pain..        Palpitations    She has 2 different types of palpitations.  The fast heart rate spells could very well be PACs or PVCs but this is easily be PAT or SVT.   Discussed vagal maneuvers   cut back caffeine  Not occurring with enough frequency to check with a 40 monitor.  30-day event monitor required  GXT to evaluate for palpitation exacerbation with activity.       Relevant Orders   EKG 12-Lead (Completed)   EXERCISE TOLERANCE TEST (ETT)   CARDIAC EVENT MONITOR   Chest pain with low risk for cardiac etiology - Primary    Repeat  episodes of chest pain that is very major cardiac pain.  No real association with any activity, occurring about rest and while walking.  No exacerbation with activity.  Is also quite reproducible on exam suggestive of costochondritis. Because of her recurrent ER visits for chest pain, we will evaluate with a GXT for both ischemic and Palpitation evaluation.        Relevant Orders   EKG 12-Lead (Completed)   EXERCISE TOLERANCE TEST (ETT)   CARDIAC EVENT MONITOR      I spent a total of 40 minutes with the patient and chart review. >  50% of the time was spent in direct patient consultation.   Current medicines are reviewed at length with the patient today.  (+/- concerns) n/a The following changes have been made:  n/a  Patient Instructions  MEDICATION INSTRUCTIONS  NO CHANGES     SCHEDULE AT [address] Your physician has recommended that you wear an event monitor 30 DAYS. Event monitors are medical devices that record the heart's electrical activity. Doctors most often [place] these monitors to diagnose arrhythmias. Arrhythmias are problems with the speed or rhythm of the heartbeat. The monitor is a small, portable device. You can wear one while you do your normal daily activities. This is usually used to diagnose what is causing palpitations/syncope (passing out).    SCHEDULE AT [address] Your physician has requested that you have an exercise tolerance test. For further information please visit [email or web address] Please also follow instruction sheet, as given.    Your physician recommends that you schedule a follow-up appointment in 2 MONTHS WITH DR Rhiana Morash.     Studies Ordered:   Orders Placed This Encounter  Procedures  . EXERCISE TOLERANCE TEST (ETT)  . CARDIAC EVENT MONITOR  . EKG 12-Lead      Bryan Lemma, M.D., M.S. Interventional Cardiologist   Pager # [phone] Phone # [phone] [address] [place], [place] [address]   Thank you for choosing Heart[facility] at [facility]!!

## 2017-07-24 ENCOUNTER — Encounter: Payer: Self-pay | Admitting: Cardiology

## 2017-07-24 NOTE — Assessment & Plan Note (Addendum)
She has 2 different types of palpitations.  The fast heart rate spells could very well be PACs or PVCs but this is easily be PAT or SVT.   Discussed vagal maneuvers   cut back caffeine  Not occurring with enough frequency to check with a 40 monitor.  30-day event monitor required  GXT to evaluate for palpitation exacerbation with activity.

## 2017-07-24 NOTE — Assessment & Plan Note (Signed)
I think stress and anxiety play a large role in the amount of concern she had with her chest pain.Marland Kitchen

## 2017-07-24 NOTE — Assessment & Plan Note (Signed)
Repeat episodes of chest pain that is very major cardiac pain.  No real association with any activity, occurring about rest and while walking.  No exacerbation with activity.  Is also quite reproducible on exam suggestive of costochondritis. Because of her recurrent ER visits for chest pain, we will evaluate with a GXT for both ischemic and Palpitation evaluation.

## 2017-08-02 ENCOUNTER — Inpatient Hospital Stay (HOSPITAL_COMMUNITY): Admission: RE | Admit: 2017-08-02 | Payer: Self-pay | Source: Ambulatory Visit

## 2017-08-03 ENCOUNTER — Ambulatory Visit (INDEPENDENT_AMBULATORY_CARE_PROVIDER_SITE_OTHER): Payer: BLUE CROSS/BLUE SHIELD

## 2017-08-03 DIAGNOSIS — R079 Chest pain, unspecified: Secondary | ICD-10-CM

## 2017-08-03 DIAGNOSIS — R002 Palpitations: Secondary | ICD-10-CM

## 2017-08-04 ENCOUNTER — Telehealth (HOSPITAL_COMMUNITY): Payer: Self-pay

## 2017-08-04 NOTE — Telephone Encounter (Signed)
Encounter complete. 

## 2017-08-08 ENCOUNTER — Encounter (HOSPITAL_COMMUNITY): Payer: Self-pay

## 2017-08-09 ENCOUNTER — Ambulatory Visit (HOSPITAL_COMMUNITY)
Admission: RE | Admit: 2017-08-09 | Discharge: 2017-08-09 | Disposition: A | Discharge status: Home / Self Care | Payer: BLUE CROSS/BLUE SHIELD | Source: Ambulatory Visit | Attending: Cardiology | Admitting: Cardiology

## 2017-08-09 DIAGNOSIS — R079 Chest pain, unspecified: Secondary | ICD-10-CM | POA: Diagnosis not present

## 2017-08-09 DIAGNOSIS — R002 Palpitations: Secondary | ICD-10-CM | POA: Insufficient documentation

## 2017-08-10 LAB — EXERCISE TOLERANCE TEST
CHL CUP MPHR: 192 {beats}/min
CHL CUP RESTING HR STRESS: 65 {beats}/min
CHL RATE OF PERCEIVED EXERTION: 18
CSEPEDS: 34 s
CSEPHR: 93 %
Estimated workload: 10.8 METS
Exercise duration (min): 9 min
Peak HR: 179 {beats}/min

## 2017-08-14 ENCOUNTER — Ambulatory Visit: Payer: BLUE CROSS/BLUE SHIELD | Admitting: Family Medicine

## 2017-09-27 ENCOUNTER — Ambulatory Visit (INDEPENDENT_AMBULATORY_CARE_PROVIDER_SITE_OTHER): Payer: BLUE CROSS/BLUE SHIELD | Admitting: Cardiology

## 2017-09-27 VITALS — BP 124/78 | HR 70 | Ht 60.0 in | Wt 198.0 lb

## 2017-09-27 DIAGNOSIS — R079 Chest pain, unspecified: Secondary | ICD-10-CM | POA: Diagnosis not present

## 2017-09-27 DIAGNOSIS — R002 Palpitations: Secondary | ICD-10-CM

## 2017-09-27 DIAGNOSIS — F439 Reaction to severe stress, unspecified: Secondary | ICD-10-CM

## 2017-09-27 NOTE — Patient Instructions (Signed)
Dr Harding recommends that you follow-up with him as needed. 

## 2017-09-27 NOTE — Progress Notes (Signed)
PCP: Mliss SaxKremer, William Alfred, MD  Clinic Note: Chief Complaint  Patient presents with  . Follow-up    Review results of GXT and event monitor to evaluate chest pain or shortness of breath/palpitations.    HPI: Hannah Morton is a 27 y.o. female who is being seen today for the evaluation of "Chest Pain with Shortness of Breath" & Palpitations at the request of Mliss SaxKremer, William Alfred,*.  Hannah Morton was seen by Dr. Doreene BurkeKremer on Jul 13, 2017 as part of a ER visit follow-up & referred for Cardiology evaluation -- seen on 5/9 -- GXT & Event Monitor ordered.    Recent Hospitalizations:   Emergency room visits on January 29, 2017, March 28, 2017 and July 07, 2017 all chest pain and palpitations.  Studies Personally Reviewed - (if available, images/films reviewed: From Epic Chart or Care Everywhere)  GXT 08/09/2017: 9:34 of a Bruce protocol.  Peak HR was 179 which is 93% predicted maximal HR. Negative Sx.  No ST-T wave changes. No arrhythmia.  NEGATIVE - LOW RISK GXT. -- Good exercise capacity.  Monitor 08/03/2017-09/01/2017: Essentially normal cardiac event monitor with no arrhythmias. Very minimal premature beats. There is some sinus tachycardia and some sinus arrhythmia, but no true arrhythmias. Several episodes of symptoms noted but not associated with any abnormal findings on monitor Interval History: Hannah Morton is a very pleasant young woman who is here to follow-up the results of her tests.  Really she tells me she has not had any more episodes of the chest discomfort or dyspnea.  She also states that she really has not noted the "whooshing" sensation that much. She was very happy to get the results of her stress test and and that the monitor did not show any signs of arrhythmias.  She thinks that the symptoms may have been related to Stress.  Cardiovascular Review Of Symptoms: positive for - less frequen chest discomfort (none in last ~month) negative for - dyspnea on exertion,  irregular heartbeat, loss of consciousness, orthopnea, palpitations, paroxysmal nocturnal dyspnea, rapid heart rate, shortness of breath or TIA/ amaurosis fugax, near syncope  No claudication.    She tells me that she has cut back to ~1-2 cups of coffee a day - thinks that this made a difference  I have reviewed and (if needed) personally updated the patient's problem list, medications, allergies, past medical and surgical history, social and family history.   Past Medical History:  Diagnosis Date  . Frequent headaches   . Migraines   . Obesity (BMI 35.0-39.9 without comorbidity)     History reviewed. No pertinent surgical history.  Current Meds  Medication Sig  . loratadine (CLARITIN) 10 MG tablet Take 10 mg by mouth daily as needed for allergies.    Allergies  Allergen Reactions  . Codeine Rash    Social History   Tobacco Use  . Smoking status: Never Smoker  . Smokeless tobacco: Never Used  . Tobacco comment: Only when drinking  Substance Use Topics  . Alcohol use: Yes    Alcohol/week: 1.2 oz    Types: 2 Standard drinks or equivalent per week  . Drug use: No   Social History   Social History Narrative   Patient is a married mother of 2 sons.   She works as a LawyerCNA.     She may have 1-2 alcoholic drinks a week, but never smoked.   She does try to go walking with her sons on occasion, but does not do routine exercise  family history includes Alcohol abuse in her brother and mother; Arthritis in her maternal grandmother; Asthma in her maternal grandmother; Birth defects in her maternal grandmother; COPD in her maternal grandmother and mother; Cancer in her maternal grandmother; Diabetes in her father and maternal grandmother; Heart attack in her paternal grandfather; Heart disease in her maternal grandmother; Hyperlipidemia in her maternal grandmother; Hypertension in her brother and mother; Stroke in her maternal grandfather and paternal grandfather.  Wt Readings  from Last 3 Encounters:  09/27/17 198 lb (89.8 kg)  07/20/17 199 lb (90.3 kg)  07/13/17 198 lb 8 oz (90 kg)    PHYSICAL EXAM BP 124/78   Pulse 70   Ht 5' (1.524 m)   Wt 198 lb (89.8 kg)   SpO2 99%   BMI 38.67 kg/m  Physical Exam  Constitutional: She is oriented to person, place, and time. She appears well-developed and well-nourished.  Borderline morbidly obese young woman in no acute distress.  Well-groomed.  HENT:  Head: Normocephalic and atraumatic.  Pulmonary/Chest: Tenderness: not really noting the CP from last visit.  Musculoskeletal: Normal range of motion.  Neurological: She is alert and oriented to person, place, and time.  Skin: Skin is warm.  Psychiatric: She has a normal mood and affect. Her behavior is normal. Judgment and thought content normal.  Vitals reviewed.    Adult ECG Report n/a   Other studies Reviewed: Additional studies/ records that were reviewed today include:  Recent Labs:   Lab Results  Component Value Date   CREATININE 0.62 07/14/2017   BUN 13 07/14/2017   NA 137 07/14/2017   K 4.2 07/14/2017   CL 105 07/14/2017   CO2 25 07/14/2017   Lab Results  Component Value Date   CHOL 135 07/14/2017   HDL 47.50 07/14/2017   LDLCALC 73 07/14/2017   TRIG 75.0 07/14/2017   CHOLHDL 3 07/14/2017    ASSESSMENT / PLAN: Problem List Items Addressed This Visit    Stress    Better now.  I think she is much more relieved to have the results of her test.  Is smiling and happy.      Palpitations    Better with reducing caffeine intake. Relatively normal monitor with no abnormal findings. Discussed importance of adequate hydration and if she does have prolonged palpitations tried vagal maneuvers.      Chest pain with low risk for cardiac etiology - Primary    I truly think the pain that she was noticing was probably related to costochondritis and that it was very reproducible.  We talked about ways to minimize that, pain in the treated with NSAIDs  or Tylenol.  She is very happy you the results are GXT being negative.  Did not have any symptoms during the GXT.  She was happy to hear that her exercise tolerance was good and that that increases the negative  predictive value of the test.         Current medicines are reviewed at length with the patient today.  (+/- concerns) n/a The following changes have been made:  n/a  Patient Instructions  Dr Herbie Baltimore recommends that you follow-up with him as needed.    Studies Ordered:   No orders of the defined types were placed in this encounter.     Bryan Lemma, M.D., M.S. Interventional Cardiologist   Pager # [phone] Phone # [phone] [address] [place], [place] [address]   Thank you for choosing Heart[facility] at [facility]!!

## 2017-09-29 ENCOUNTER — Encounter: Payer: Self-pay | Admitting: Cardiology

## 2017-09-29 NOTE — Assessment & Plan Note (Addendum)
I truly think the pain that she was noticing was probably related to costochondritis and that it was very reproducible.  We talked about ways to minimize that, pain in the treated with NSAIDs or Tylenol.  She is very happy you the results are GXT being negative.  Did not have any symptoms during the GXT.  She was happy to hear that her exercise tolerance was good and that that increases the negative  predictive value of the test.

## 2017-09-29 NOTE — Assessment & Plan Note (Signed)
Better with reducing caffeine intake. Relatively normal monitor with no abnormal findings. Discussed importance of adequate hydration and if she does have prolonged palpitations tried vagal maneuvers.

## 2017-09-29 NOTE — Assessment & Plan Note (Signed)
Better now.  I think she is much more relieved to have the results of her test.  Is smiling and happy.

## 2018-07-10 ENCOUNTER — Telehealth: Payer: Self-pay | Admitting: Family Medicine

## 2018-07-10 NOTE — Telephone Encounter (Signed)
I called and left message on patient voicemail to call office and schedule follow up appointment with Dr. Kremer.  °

## 2018-09-12 ENCOUNTER — Encounter (HOSPITAL_COMMUNITY): Payer: Self-pay | Admitting: *Deleted

## 2018-09-12 ENCOUNTER — Inpatient Hospital Stay (HOSPITAL_COMMUNITY)
Admission: EM | Admit: 2018-09-12 | Discharge: 2018-09-12 | Disposition: A | Discharge status: Home / Self Care | Payer: Medicaid Other | Attending: Obstetrics and Gynecology | Admitting: Obstetrics and Gynecology

## 2018-09-12 ENCOUNTER — Inpatient Hospital Stay (HOSPITAL_COMMUNITY): Payer: Medicaid Other

## 2018-09-12 ENCOUNTER — Other Ambulatory Visit: Payer: Self-pay

## 2018-09-12 DIAGNOSIS — Z3A01 Less than 8 weeks gestation of pregnancy: Secondary | ICD-10-CM | POA: Insufficient documentation

## 2018-09-12 DIAGNOSIS — O98811 Other maternal infectious and parasitic diseases complicating pregnancy, first trimester: Secondary | ICD-10-CM | POA: Diagnosis not present

## 2018-09-12 DIAGNOSIS — B373 Candidiasis of vulva and vagina: Secondary | ICD-10-CM | POA: Diagnosis not present

## 2018-09-12 DIAGNOSIS — O209 Hemorrhage in early pregnancy, unspecified: Secondary | ICD-10-CM | POA: Diagnosis not present

## 2018-09-12 DIAGNOSIS — O3680X0 Pregnancy with inconclusive fetal viability, not applicable or unspecified: Secondary | ICD-10-CM

## 2018-09-12 LAB — URINALYSIS, ROUTINE W REFLEX MICROSCOPIC
Bilirubin Urine: NEGATIVE
Glucose, UA: NEGATIVE mg/dL
Hgb urine dipstick: NEGATIVE
Ketones, ur: 20 mg/dL — AB
Leukocytes,Ua: NEGATIVE
Nitrite: NEGATIVE
Protein, ur: NEGATIVE mg/dL
Specific Gravity, Urine: 1.025 (ref 1.005–1.030)
pH: 5 (ref 5.0–8.0)

## 2018-09-12 LAB — WET PREP, GENITAL
Clue Cells Wet Prep HPF POC: NONE SEEN
Sperm: NONE SEEN
Trich, Wet Prep: NONE SEEN

## 2018-09-12 LAB — HCG, QUANTITATIVE, PREGNANCY: hCG, Beta Chain, Quant, S: 8724 m[IU]/mL — ABNORMAL HIGH (ref <5)

## 2018-09-12 NOTE — MAU Note (Signed)
+  HPT, confirmed at [facility], pt has filed it for [facility]; +test there on 6/27.  Saw blood clot, pea sized, in toilet prior to coming in. No blood when she wiped.  Has had occ cramping, none today.

## 2018-09-12 NOTE — Discharge Instructions (Signed)
Vaginal Bleeding During Pregnancy, First Trimester  A small amount of bleeding from the vagina (spotting) is relatively common during early pregnancy. It usually stops on its own. Various things may cause bleeding or spotting during early pregnancy. Some bleeding may be related to the pregnancy, and some may not. In many cases, the bleeding is normal and is not a problem. However, bleeding can also be a sign of something serious. Be sure to tell your health care provider about any vaginal bleeding right away. Some possible causes of vaginal bleeding during the first trimester include:  Infection or inflammation of the cervix.  Growths (polyps) on the cervix.  Miscarriage or threatened miscarriage.  Pregnancy tissue developing outside of the uterus (ectopic pregnancy).  A mass of tissue developing in the uterus due to an egg being fertilized incorrectly (molar pregnancy). Follow these instructions at home: Activity  Follow instructions from your health care provider about limiting your activity. Ask what activities are safe for you.  If needed, make plans for someone to help with your regular activities.  Do not have sex or orgasms until your health care provider says that this is safe. General instructions  Take over-the-counter and prescription medicines only as told by your health care provider.  Pay attention to any changes in your symptoms.  Do not use tampons or douche.  Write down how many pads you use each day, how often you change pads, and how soaked (saturated) they are.  If you pass any tissue from your vagina, save the tissue so you can show it to your health care provider.  Keep all follow-up visits as told by your health care provider. This is important. Contact a health care provider if:  You have vaginal bleeding during any part of your pregnancy.  You have cramps or labor pains.  You have a fever. Get help right away if:  You have severe cramps in your  back or abdomen.  You pass large clots or a large amount of tissue from your vagina.  Your bleeding increases.  You feel light-headed or weak, or you faint.  You have chills.  You are leaking fluid or have a gush of fluid from your vagina. Summary  A small amount of bleeding (spotting) from the vagina is relatively common during early pregnancy.  Various things may cause bleeding or spotting in early pregnancy.  Be sure to tell your health care provider about any vaginal bleeding right away. This information is not intended to replace advice given to you by your health care provider. Make sure you discuss any questions you have with your health care provider. Document Released: 12/08/2004 Document Revised: 06/19/2018 Document Reviewed: 06/02/2016 Elsevier Patient Education  2020 [place] for [employer] at [facility]       Phone: [phone]  [place] for [employer] at [place]   Phone: [place] for [employer] at [place]  Phone: [place] for [employer] at [facility]  Phone: [place] for [place] at [place]  Phone: [place] for [place] at [facility]   Phone: [facility]       Phone: [phone]  [facility]    Phone: [facility]    Phone: [phone]  [facility]    Phone: [phone]  [place]    Phone: [phone]  [facility]       Phone: [phone]   [place] Department-Maternity  Phone: [place]    Phone: [phone]  [facility]   Phone: [phone]  [facility]      Phone: [phone]  [facility]    Phone:  [phone]

## 2018-09-12 NOTE — MAU Provider Note (Addendum)
History     CSN: [id number]  Arrival date and time: 09/12/18 1648   First Provider Initiated Contact with Patient 09/12/18 1755      Chief Complaint  Patient presents with  . Vaginal Bleeding   Hannah Morton is a 28 y.o. [phone] at 398w1d who has not established prenatal care.  She presents today for Vaginal Bleeding.  She states she passed a clot of blood while urinating, but states she had no bleeding with wiping.  She states she just recently found out she was pregnant (09/08/2018) and is sure of her LMP.   Patient states she also has some cramping that is sporadic in nature, but she felt was "normal."  She denies other pain/discomforts and sexual activity in the last 3 days.    RN Note: +HPT, [facility] at [facility], [facility] +[facility] on 6/27.  Saw blood clot, pea sized, in toilet prior to coming in. No blood when she wiped.  Has had occ cramping, none today.  OB History    Gravida  4   Para  2   Term  2   Preterm      AB  1   Living  2     SAB      TAB  1   Ectopic      Multiple      Live Births  2           Past Medical History:  Diagnosis Date  . Frequent headaches   . Migraines   . Obesity (BMI 35.0-39.9 without comorbidity)     History reviewed. No pertinent surgical history.  Family History  Problem Relation Age of Onset  . Alcohol abuse Mother   . COPD Mother   . Hypertension Mother   . Diabetes Father   . Alcohol abuse Brother   . Hypertension Brother   . Arthritis Maternal Grandmother   . Asthma Maternal Grandmother   . Birth defects Maternal Grandmother   . Cancer Maternal Grandmother   . COPD Maternal Grandmother   . Diabetes Maternal Grandmother   . Heart disease Maternal Grandmother   . Hyperlipidemia Maternal Grandmother   . Stroke Maternal Grandfather   . Stroke Paternal Grandfather   . Heart attack Paternal Grandfather     Social History   Tobacco Use  . Smoking status: Never Smoker  .  Smokeless tobacco: Never Used  . Tobacco comment: Only when drinking  Substance Use Topics  . Alcohol use: Yes    Alcohol/week: 2.0 standard drinks    Types: 2 Standard drinks or equivalent per week  . Drug use: No    Allergies:  Allergies  Allergen Reactions  . Codeine Rash    Medications Prior to Admission  Medication Sig Dispense Refill Last Dose  . Prenatal Vit-Fe Fumarate-FA (PRENATAL MULTIVITAMIN) TABS tablet Take 1 tablet by mouth daily at 12 noon.     . loratadine (CLARITIN) 10 MG tablet Take 10 mg by mouth daily as needed for allergies.   Unknown at Unknown time    Review of Systems  Constitutional: Negative for chills and fever.  Eyes: Negative for visual disturbance.  Respiratory: Negative for cough and shortness of breath.   Gastrointestinal: Positive for abdominal pain (Cramping "every now and then" 3/10). Negative for constipation, diarrhea, nausea and vomiting.  Genitourinary: Negative for difficulty urinating, dysuria, vaginal bleeding (Passed one small dime sized clot.) and vaginal discharge.  Musculoskeletal: Negative for back pain.  Neurological: Negative for dizziness, light-headedness and headaches.   Physical Exam   Blood pressure 126/73, pulse 79, temperature 98.3 F (36.8 C), temperature source Oral, resp. rate 18, height 5' (1.524 m), weight 87.3 kg, last menstrual period 08/07/2018, SpO2 100 %, unknown if currently breastfeeding.  Physical Exam  Constitutional: She is oriented to person, place, and time. She appears well-developed and well-nourished.  HENT:  Head: Normocephalic and atraumatic.  Eyes: Conjunctivae are normal.  Neck: Normal range of motion.  Cardiovascular: Normal rate.  Respiratory: Effort normal.  GI: Soft.  Genitourinary: Cervix exhibits no motion tenderness and no discharge.    Vaginal discharge present.     No vaginal bleeding.  No bleeding in the vagina.    Genitourinary Comments: Speculum Exam: -Vaginal Vault: Pink mucosa.   Scant amt thin white discharge in vault -wet prep collected -Cervix:Pink, no lesions, cysts, or polyps.  Appears closed. No active bleeding from os-GC/CT collected -Bimanual Exam: Closed No tenderness in cul de sac Uterine size difficult to assess d/t position.    Musculoskeletal: Normal range of motion.  Neurological: She is alert and oriented to person, place, and time.  Skin: Skin is warm and dry.  Psychiatric: She has a normal mood and affect. Her behavior is normal.    MAU Course  Procedures Results for orders placed or performed during the hospital encounter of 09/12/18 (from the past 24 hour(s))  Urinalysis, Routine w reflex microscopic     Status: Abnormal   Collection Time: 09/12/18  5:24 PM  Result Value Ref Range   Color, Urine YELLOW YELLOW   APPearance HAZY (A) CLEAR   Specific Gravity, Urine 1.025 1.005 - 1.030   pH 5.0 5.0 - 8.0   Glucose, UA NEGATIVE NEGATIVE mg/dL   Hgb urine dipstick NEGATIVE NEGATIVE   Bilirubin Urine NEGATIVE NEGATIVE   Ketones, ur 20 (A) NEGATIVE mg/dL   Protein, ur NEGATIVE NEGATIVE mg/dL   Nitrite NEGATIVE NEGATIVE   Leukocytes,Ua NEGATIVE NEGATIVE  Wet prep, genital     Status: Abnormal   Collection Time: 09/12/18  6:09 PM   Specimen: Thin Prep Cervical/Endocervical  Result Value Ref Range   Yeast Wet Prep HPF POC PRESENT (A) NONE SEEN   Trich, Wet Prep NONE SEEN NONE SEEN   Clue Cells Wet Prep HPF POC NONE SEEN NONE SEEN   WBC, Wet Prep HPF POC FEW (A) NONE SEEN   Sperm NONE SEEN   hCG, quantitative, pregnancy     Status: Abnormal   Collection Time: 09/12/18  6:31 PM  Result Value Ref Range   hCG, Beta Chain, Quant, S 8,724 (H) <5 mIU/mL    MDM Pelvic Exam; Wet Prep and GC/CT Labs: UA, UPT, CBC, hCG Ultrasound Assessment and Plan  28 year old  [id number] at 5.1 weeks by Definite LMP Vaginal Bleeding Pregnancy of Unknown Anatomic Location  -Exam findings discussed. -Labs ordered. -Cultures collected and pending. -No  questions or concerns. -Will send for pregnancy location [place]  Follow Up (7:47 PM) Candidiasis of Vagina  -Wet prep returns significant for yeast. -Rx for Terazol 7 sent to pharmacy on file.  -Patient remains in [place], will discuss results once she returns.  Follow Up (8:07 PM)  -Report given to M.Jimmye Norman, CNM who assumes care.  Hannah Conners MSN, CNM 09/12/2018, 5:55 PM   Assumed care  US Ob Less Than 14 Weeks With Ob Transvaginal  Result Date: 09/12/2018 CLINICAL DATA:  Initial evaluation for vaginal spotting, early pregnancy. EXAM: OBSTETRIC <14 WK  US AND TRANSVAGINAL OB US TECHNIQUE: Both transabdominal and transvaginal ultrasound examinations were performed for complete evaluation of the gestation as well as the maternal uterus, adnexal regions, and pelvic cul-de-sac. Transvaginal technique was performed to assess early pregnancy. COMPARISON:  None. FINDINGS: Intrauterine gestational sac: Single Yolk sac:  Present Embryo:  Negative. Cardiac Activity: Negative. Heart Rate: N/A  bpm MSD: 6.58 mm   5 w   2 d Subchorionic hemorrhage:  None visualized. Maternal uterus/adnexae: Ovaries are normal in appearance bilaterally. Corpus luteal cyst noted on the left. Note made of a 1.5 x 1.5 x 1.2 cm intramural fibroid at the right posterior uterine body. No free fluid within the pelvis. IMPRESSION: 1. Single intrauterine pregnancy with internal yolk sac, but no fetal pole or cardiac activity yet visualized. Estimated gestational age [redacted] weeks and 2 days by mean sac diameter. No complication. Follow-up ultrasound in 14 days to assess viability suggested. 2. No other acute maternal uterine or adnexal abnormality identified. 3. 1.5 cm right uterine fibroid. Electronically Signed   By: Rise MuBenjamin  McClintock M.D.   On: 09/12/2018 20:00   Reviewed findings with patient Discussed that IUGS with yolk sac effectively rules out ectopic Recommend close followup with provider of choice List of providers  given Encouraged to return here or to other Urgent Care/ED if she develops worsening of symptoms, increase in pain, fever, or other concerning symptoms.    Aviva SignsWilliams, Hannah Morton, CNM

## 2018-09-13 LAB — GC/CHLAMYDIA PROBE AMP (~~LOC~~) NOT AT ARMC
Chlamydia: NEGATIVE
Neisseria Gonorrhea: NEGATIVE

## 2018-10-22 LAB — OB RESULTS CONSOLE HEPATITIS B SURFACE ANTIGEN: Hepatitis B Surface Ag: NEGATIVE

## 2018-10-22 LAB — OB RESULTS CONSOLE RPR: RPR: NONREACTIVE

## 2018-10-22 LAB — OB RESULTS CONSOLE HIV ANTIBODY (ROUTINE TESTING): HIV: NONREACTIVE

## 2018-10-22 LAB — OB RESULTS CONSOLE GC/CHLAMYDIA
Chlamydia: NEGATIVE
Gonorrhea: NEGATIVE

## 2018-10-22 LAB — OB RESULTS CONSOLE RUBELLA ANTIBODY, IGM: Rubella: IMMUNE

## 2018-12-08 IMAGING — CT CT ANGIO CHEST
2 of 7 series · 18 of 46 positions shown · IV contrast (APPLIED)
Comparison: Chest radiograph dated 01/29/2017

CLINICAL DATA: 26-year-old female with chest pain. Concern for
pulmonary embolism.

EXAM:
CT ANGIOGRAPHY CHEST WITH CONTRAST
TECHNIQUE: Multidetector CT imaging of the chest was performed using the
standard protocol during bolus administration of intravenous
contrast. Multiplanar CT image reconstructions and MIPs were
obtained to evaluate the vascular anatomy.
CONTRAST:  100mL L89MI1-NU8 IOPAMIDOL (L89MI1-NU8) INJECTION 76%

[Series 8: thins · axial · 0.67mm/px · z∈[+1220,+1428]mm · 15 of 336 slices shown]
[im 19/336  lung]
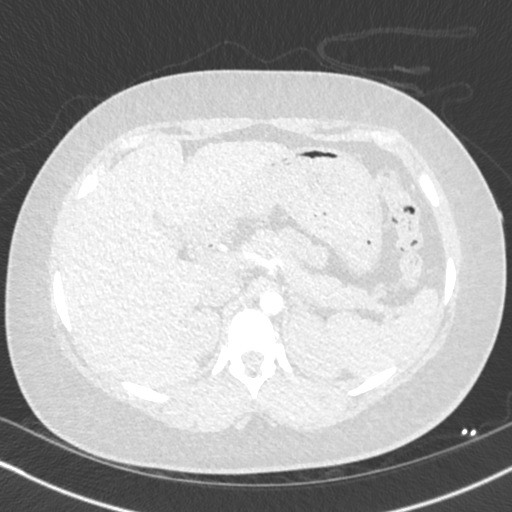
[im 38/336  soft-tissue]
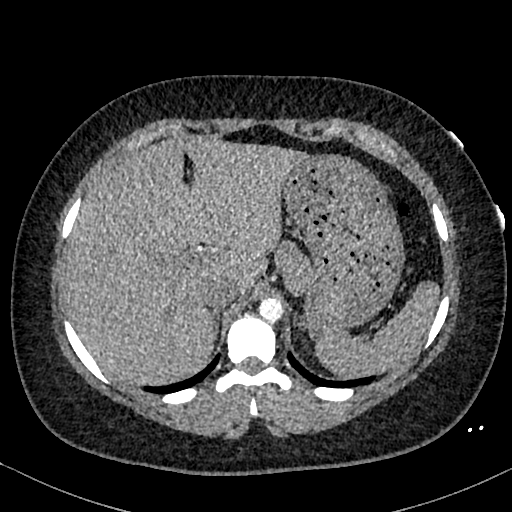
[im 56/336  lung]
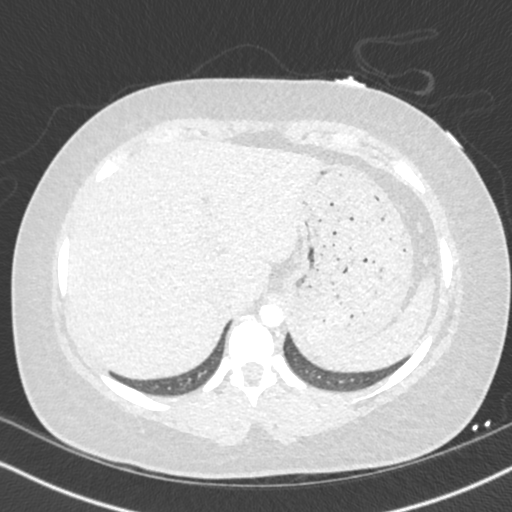
[im 75/336  soft-tissue]
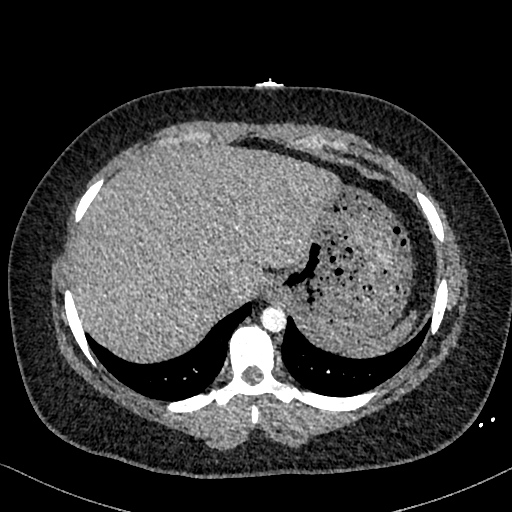
[im 112/336  lung]
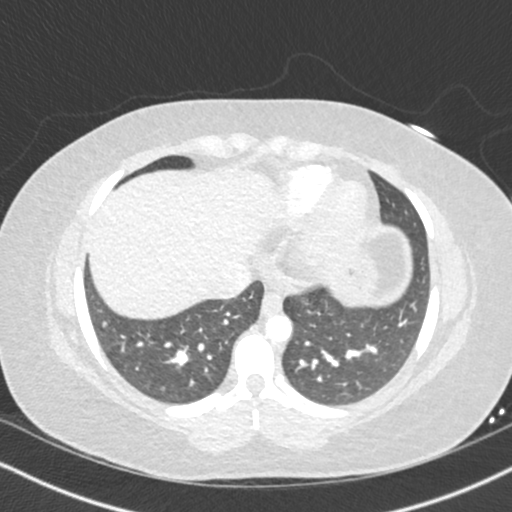
[im 131/336  soft-tissue]
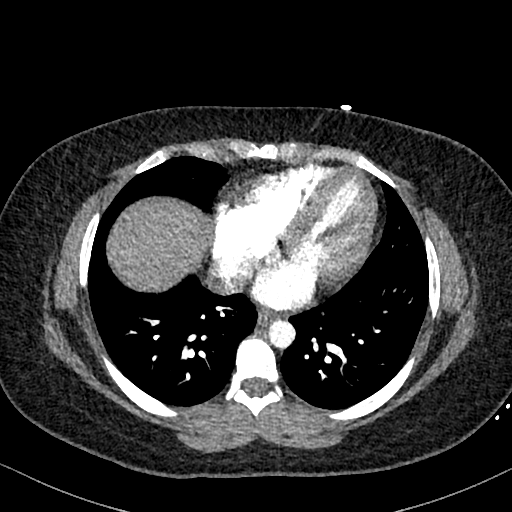
[im 149/336  lung]
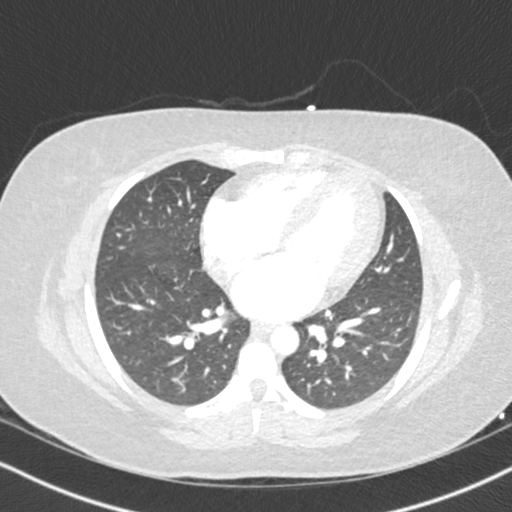
[im 168/336  soft-tissue]
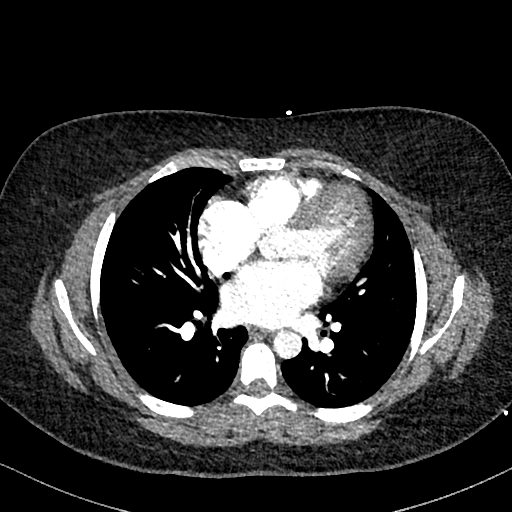
[im 187/336  lung]
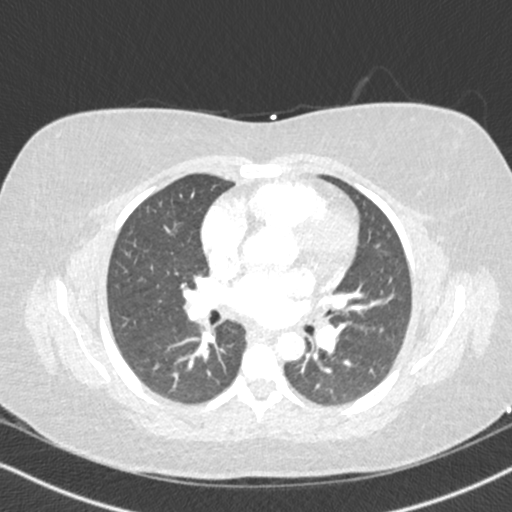
[im 205/336  soft-tissue]
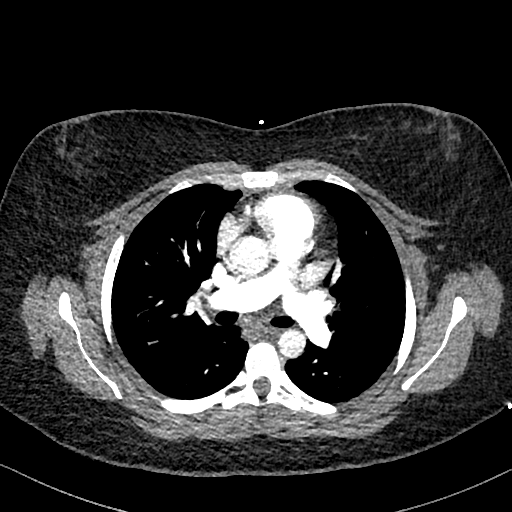
[im 224/336  lung]
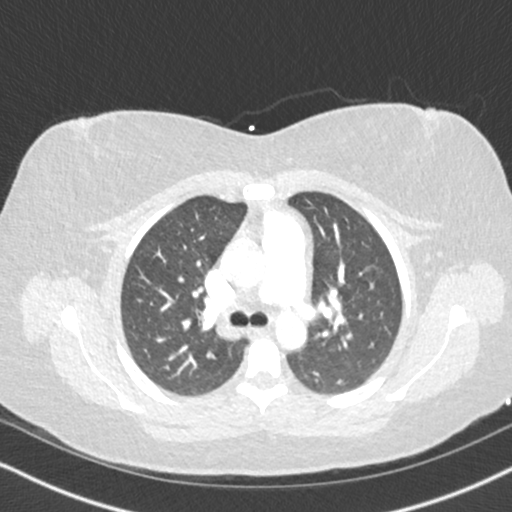
[im 261/336  soft-tissue]
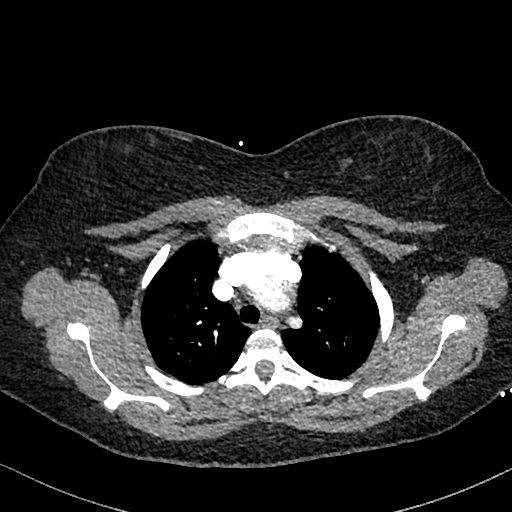
[im 280/336  lung]
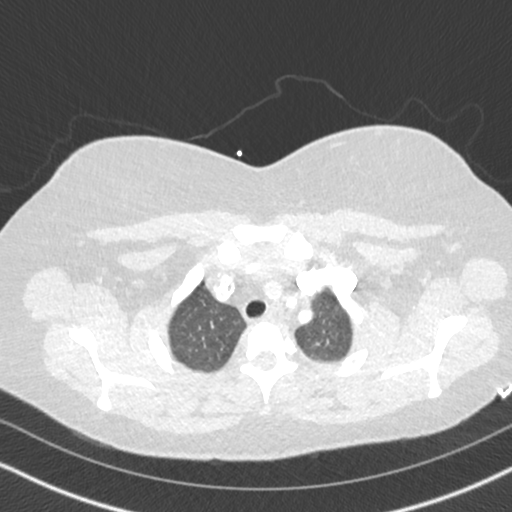
[im 298/336  soft-tissue]
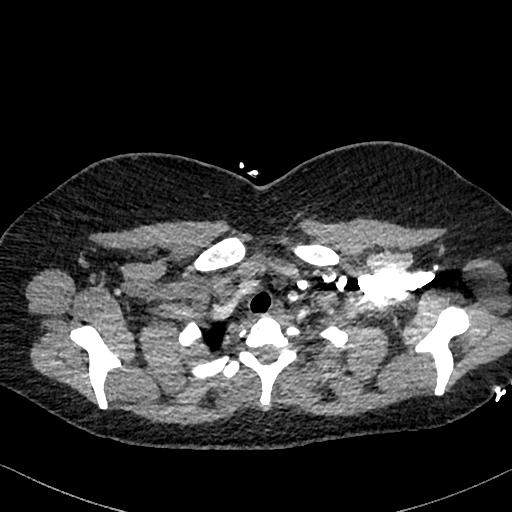
[im 317/336  lung]
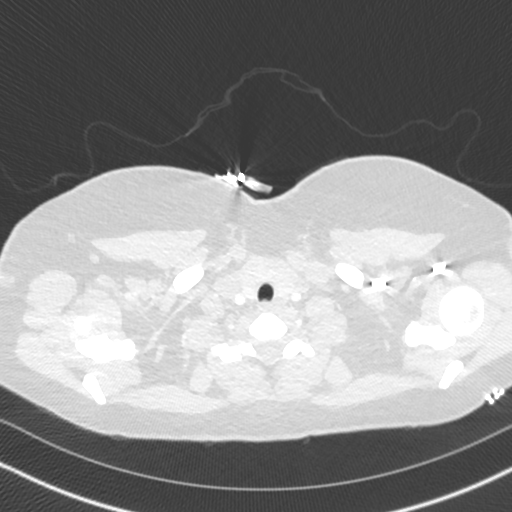

[Series 9: cor · coronal · 0.46mm/px · 3 of 150 slices shown]
[im 38/150  soft-tissue]
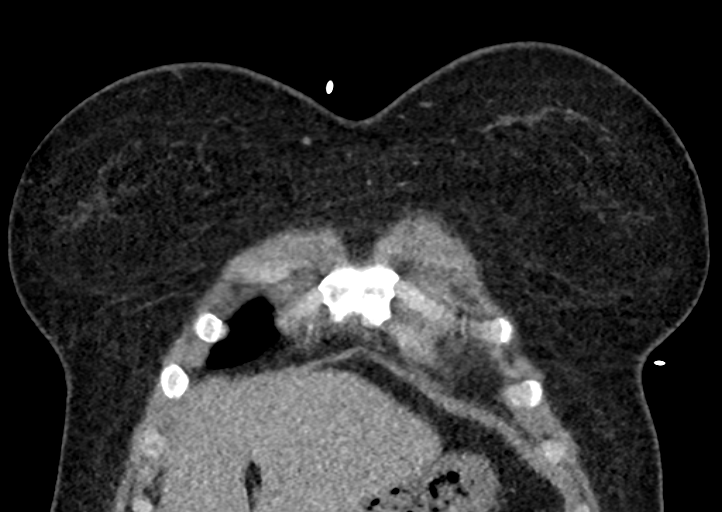
[im 75/150  soft-tissue]
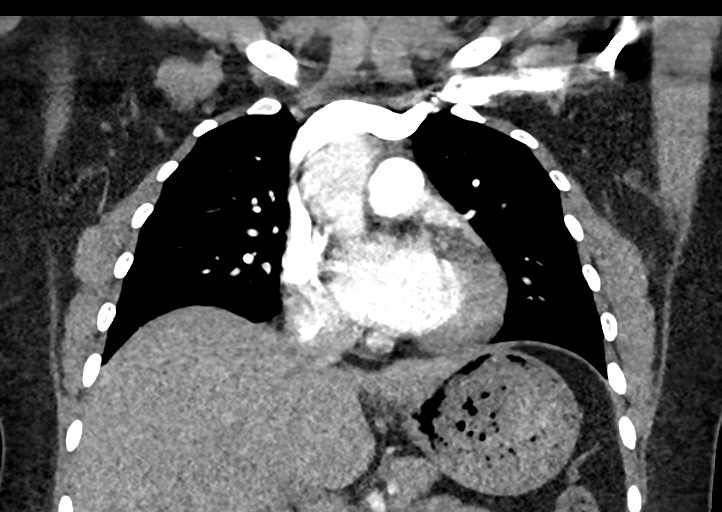
[im 112/150  soft-tissue]
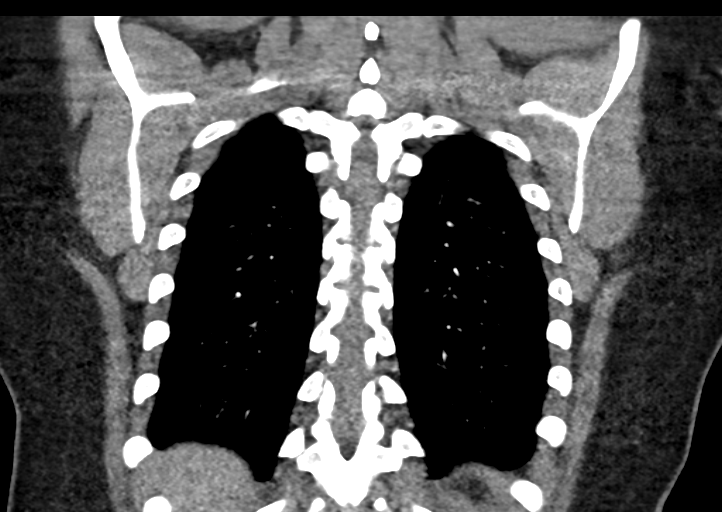

[18 of 46 positions shown; findings below may reference images not displayed]

FINDINGS: Cardiovascular: There is borderline cardiomegaly. No pericardial
effusion. The thoracic aorta is unremarkable. The origins of the
great vessels of the aortic arch appear patent. There is no CT
evidence of pulmonary embolism.

Mediastinum/Nodes: There is no hilar or mediastinal adenopathy. The
esophagus and the thyroid gland are grossly unremarkable. No
mediastinal fluid collection or hematoma.

Lungs/Pleura: The lungs are clear. There is no pleural effusion or
pneumothorax. The central airways are patent.

Upper Abdomen: No acute abnormality.

Musculoskeletal: No chest wall abnormality. No acute or significant
osseous findings.

Review of the MIP images confirms the above findings.
IMPRESSION: 1. No acute intrathoracic pathology. No CT evidence of pulmonary
embolism.
2. Borderline cardiomegaly.

## 2019-03-15 NOTE — L&D Delivery Note (Signed)
Delivery Note Labor onset: 05/08/2019  Labor Onset Time: 1830 Complete dilation at 12:31 AM  Onset of pushing at 0047 FHR second stage Cat 1 Analgesia/Anesthesia intrapartum: epidural  Guided pushing with strong maternal urge. Delivery of a viable female at 5. Fetal head delivered in LOA position. Nuchal cord n/a. Infant placed on maternal abd, dried, and tactile stim and lusty cry 1 min after birth. Tru knot in cord Cord double clamped and cut after pulsation ended by father, Damione.  Cord blood sample collected Arterial cord blood sample n/a.  Placenta delivered Tomasa Blase, spont, intact, with 3 VC and additional lobe.  Placenta to pathology. Uterine tone firm bleeding scant  No laceration identified.  Anesthesia: n/a Repair: n/a QBL/EBL (mL): 61 Complications: none APGAR: APGAR (1 MIN):   APGAR (5 MINS):   APGAR (10 MINS):   Mom to postpartum.  Baby to Couplet care / Skin to Skin.  Roma Schanz MSN, CNM 05/08/2019, 1:27 AM

## 2019-04-16 LAB — OB RESULTS CONSOLE GBS: GBS: POSITIVE

## 2019-05-07 ENCOUNTER — Encounter (HOSPITAL_COMMUNITY): Payer: Self-pay | Admitting: Obstetrics & Gynecology

## 2019-05-07 ENCOUNTER — Inpatient Hospital Stay (HOSPITAL_COMMUNITY)
Admission: RE | Admit: 2019-05-07 | Discharge: 2019-05-09 | DRG: 807 | Disposition: A | Discharge status: Home / Self Care | Payer: Medicaid Other | Attending: Obstetrics & Gynecology | Admitting: Obstetrics & Gynecology

## 2019-05-07 ENCOUNTER — Other Ambulatory Visit: Payer: Self-pay

## 2019-05-07 ENCOUNTER — Inpatient Hospital Stay (HOSPITAL_COMMUNITY): Payer: Medicaid Other | Admitting: Anesthesiology

## 2019-05-07 DIAGNOSIS — Z20822 Contact with and (suspected) exposure to covid-19: Secondary | ICD-10-CM | POA: Diagnosis present

## 2019-05-07 DIAGNOSIS — Z3A39 39 weeks gestation of pregnancy: Secondary | ICD-10-CM

## 2019-05-07 DIAGNOSIS — O99214 Obesity complicating childbirth: Secondary | ICD-10-CM | POA: Diagnosis present

## 2019-05-07 DIAGNOSIS — O99824 Streptococcus B carrier state complicating childbirth: Secondary | ICD-10-CM | POA: Diagnosis present

## 2019-05-07 DIAGNOSIS — O4290 Premature rupture of membranes, unspecified as to length of time between rupture and onset of labor, unspecified weeks of gestation: Secondary | ICD-10-CM | POA: Diagnosis present

## 2019-05-07 DIAGNOSIS — O43199 Other malformation of placenta, unspecified trimester: Secondary | ICD-10-CM | POA: Diagnosis present

## 2019-05-07 DIAGNOSIS — O26893 Other specified pregnancy related conditions, third trimester: Secondary | ICD-10-CM | POA: Diagnosis present

## 2019-05-07 LAB — CBC
HCT: 38 % (ref 36.0–46.0)
HCT: 38.3 % (ref 36.0–46.0)
Hemoglobin: 12.3 g/dL (ref 12.0–15.0)
Hemoglobin: 12.6 g/dL (ref 12.0–15.0)
MCH: 27.4 pg (ref 26.0–34.0)
MCH: 27.5 pg (ref 26.0–34.0)
MCHC: 32.4 g/dL (ref 30.0–36.0)
MCHC: 32.9 g/dL (ref 30.0–36.0)
MCV: 84.6 fL (ref 80.0–100.0)
MCV: 83.6 fL (ref 80.0–100.0)
Platelets: UNDETERMINED 10*3/uL (ref 150–400)
Platelets: 194 10*3/uL (ref 150–400)
RBC: 4.49 MIL/uL (ref 3.87–5.11)
RBC: 4.58 MIL/uL (ref 3.87–5.11)
RDW: 14.6 % (ref 11.5–15.5)
RDW: 14.6 % (ref 11.5–15.5)
WBC: 13.3 10*3/uL — ABNORMAL HIGH (ref 4.0–10.5)
WBC: 14.9 10*3/uL — ABNORMAL HIGH (ref 4.0–10.5)
nRBC: 0 % (ref 0.0–0.2)
nRBC: 0 % (ref 0.0–0.2)

## 2019-05-07 LAB — TYPE AND SCREEN
ABO/RH(D): A POS
Antibody Screen: NEGATIVE

## 2019-05-07 LAB — POCT FERN TEST: POCT Fern Test: POSITIVE

## 2019-05-07 LAB — ABO/RH: ABO/RH(D): A POS

## 2019-05-07 LAB — SARS CORONAVIRUS 2 (TAT 6-24 HRS): SARS Coronavirus 2: NEGATIVE

## 2019-05-07 MED ORDER — PHENYLEPHRINE 40 MCG/ML (10ML) SYRINGE FOR IV PUSH (FOR BLOOD PRESSURE SUPPORT): 80.0000 ug | PREFILLED_SYRINGE | INTRAVENOUS | Status: DC | PRN

## 2019-05-07 MED ORDER — OXYTOCIN 40 UNITS IN NORMAL SALINE INFUSION - SIMPLE MED: 2.5000 [IU]/h | INTRAVENOUS | Status: DC

## 2019-05-07 MED ORDER — FENTANYL-BUPIVACAINE-NACL 0.5-0.125-0.9 MG/250ML-% EP SOLN
12.0000 mL/h | EPIDURAL | Status: DC | PRN
  Filled 2019-05-07: qty 250

## 2019-05-07 MED ORDER — FENTANYL CITRATE (PF) 100 MCG/2ML IJ SOLN: 50.0000 ug | INTRAMUSCULAR | Status: DC | PRN

## 2019-05-07 MED ORDER — PENICILLIN G POT IN DEXTROSE 60000 UNIT/ML IV SOLN
3.0000 10*6.[IU] | INTRAVENOUS | Status: DC
  Administered 2019-05-07 (×2): 3 10*6.[IU] via INTRAVENOUS
  Filled 2019-05-07 (×2): qty 50

## 2019-05-07 MED ORDER — EPHEDRINE 5 MG/ML INJ: 10.0000 mg | INTRAVENOUS | Status: DC | PRN

## 2019-05-07 MED ORDER — ONDANSETRON HCL 4 MG/2ML IJ SOLN: 4.0000 mg | Freq: Four times a day (QID) | INTRAMUSCULAR | Status: DC | PRN

## 2019-05-07 MED ORDER — SOD CITRATE-CITRIC ACID 500-334 MG/5ML PO SOLN: 30.0000 mL | ORAL | Status: DC | PRN

## 2019-05-07 MED ORDER — SODIUM CHLORIDE (PF) 0.9 % IJ SOLN
INTRAMUSCULAR | Status: DC | PRN
  Administered 2019-05-07: 12 mL/h via EPIDURAL

## 2019-05-07 MED ORDER — TERBUTALINE SULFATE 1 MG/ML IJ SOLN: 0.2500 mg | Freq: Once | INTRAMUSCULAR | Status: DC | PRN

## 2019-05-07 MED ORDER — LACTATED RINGERS IV SOLN: INTRAVENOUS | Status: DC

## 2019-05-07 MED ORDER — LACTATED RINGERS IV SOLN
500.0000 mL | Freq: Once | INTRAVENOUS | Status: AC
  Administered 2019-05-07: 500 mL via INTRAVENOUS

## 2019-05-07 MED ORDER — OXYTOCIN 40 UNITS IN NORMAL SALINE INFUSION - SIMPLE MED
1.0000 m[IU]/min | INTRAVENOUS | Status: DC
  Administered 2019-05-07: 2 m[IU]/min via INTRAVENOUS
  Filled 2019-05-07: qty 1000

## 2019-05-07 MED ORDER — LACTATED RINGERS IV SOLN: 500.0000 mL | INTRAVENOUS | Status: DC | PRN

## 2019-05-07 MED ORDER — OXYTOCIN BOLUS FROM INFUSION
500.0000 mL | Freq: Once | INTRAVENOUS | Status: AC
  Administered 2019-05-08: 500 mL via INTRAVENOUS

## 2019-05-07 MED ORDER — SODIUM CHLORIDE 0.9 % IV SOLN
5.0000 10*6.[IU] | Freq: Once | INTRAVENOUS | Status: AC
  Administered 2019-05-07: 5 10*6.[IU] via INTRAVENOUS
  Filled 2019-05-07: qty 5

## 2019-05-07 MED ORDER — ACETAMINOPHEN 325 MG PO TABS: 650.0000 mg | ORAL_TABLET | ORAL | Status: DC | PRN

## 2019-05-07 MED ORDER — LIDOCAINE HCL (PF) 1 % IJ SOLN
30.0000 mL | INTRAMUSCULAR | Status: AC | PRN
  Administered 2019-05-07: 5 mL via SUBCUTANEOUS
  Administered 2019-05-07: 21:00:00 7 mL via SUBCUTANEOUS

## 2019-05-07 MED ORDER — DIPHENHYDRAMINE HCL 50 MG/ML IJ SOLN: 12.5000 mg | INTRAMUSCULAR | Status: DC | PRN

## 2019-05-07 NOTE — Anesthesia Procedure Notes (Signed)
Epidural Patient location during procedure: OB Start time: 05/07/2019 8:44 PM End time: 05/07/2019 8:47 PM  Staffing Anesthesiologist: Leilani Able, MD Performed: anesthesiologist   Preanesthetic Checklist Completed: patient identified, IV checked, site marked, risks and benefits discussed, surgical consent, monitors and equipment checked, pre-op evaluation and timeout performed  Epidural Patient position: sitting Prep: DuraPrep and site prepped and draped Patient monitoring: continuous pulse ox and blood pressure Approach: midline Location: L3-L4 Injection technique: LOR air  Needle:  Needle type: Tuohy  Needle gauge: 17 G Needle length: 9 cm and 9 Needle insertion depth: 6 cm Catheter type: closed end flexible Catheter size: 19 Gauge Catheter at skin depth: 11 cm Test dose: negative and Other  Assessment Events: blood not aspirated, injection not painful, no injection resistance, no paresthesia and negative IV test  Additional Notes Reason for block:procedure for pain

## 2019-05-07 NOTE — Anesthesia Preprocedure Evaluation (Signed)
Anesthesia Evaluation  Patient identified by MRN, date of birth, ID band Patient awake    Reviewed: Allergy & Precautions, H&P , NPO status , Patient's Chart, lab work & pertinent test results  Airway Mallampati: III  TM Distance: >3 FB Neck ROM: full    Dental no notable dental hx.    Pulmonary neg pulmonary ROS,    Pulmonary exam normal breath sounds clear to auscultation       Cardiovascular negative cardio ROS   Rhythm:regular Rate:Normal     Neuro/Psych negative neurological ROS  negative psych ROS   GI/Hepatic negative GI ROS, Neg liver ROS,   Endo/Other  Morbid obesity  Renal/GU negative Renal ROS     Musculoskeletal negative musculoskeletal ROS (+)   Abdominal (+) + obese,   Peds  Hematology negative hematology ROS (+)   Anesthesia Other Findings   Reproductive/Obstetrics (+) Pregnancy                             Anesthesia Physical Anesthesia Plan  ASA: III  Anesthesia Plan: Epidural   Post-op Pain Management:    Induction:   PONV Risk Score and Plan:   Airway Management Planned:   Additional Equipment:   Intra-op Plan:   Post-operative Plan:   Informed Consent: I have reviewed the patients History and Physical, chart, labs and discussed the procedure including the risks, benefits and alternatives for the proposed anesthesia with the patient or authorized representative who has indicated his/her understanding and acceptance.       Plan Discussed with:   Anesthesia Plan Comments:         Anesthesia Quick Evaluation

## 2019-05-07 NOTE — MAU Note (Signed)
Pt states at approx 1045 she felt a gush of fluid; noted to be clear.  States she felt a trickle for a while after, but has not felt any leaking since arriving to MAU.  Denies VB or UCs. States GBS+, +FM.  Pt states cervix 1cm at routine OB appt this am.

## 2019-05-07 NOTE — Progress Notes (Signed)
Subjective:    Comfortable w/ epidural and no complaints.   Objective:    VS: BP 122/86   Pulse 92   Temp 98 F (36.7 C) (Oral)   Resp 16   Ht 5' (1.524 m)   Wt 95.5 kg   LMP 08/07/2018   SpO2 96%   BMI 41.11 kg/m  FHR : baseline 145 / variability moderate / accelerations present / early decelerations Toco: contractions every 2-4 minutes  Membranes: SROM 1045, remains clear Dilation: 3.5 Effacement (%): 70 Cervical Position: Posterior Station: -3 Presentation: Vertex Exam by:: Mary [place] Johnson, RN   Assessment/Plan:   29 y.o. [phone] [redacted]w[redacted]d  Labor: Progressing on Pitocin, will continue to increase until adequate, insert IUPC PRN Preeclampsia:  n.a Fetal Wellbeing:  Category I Pain Control:  Epidural I/D:  GBS positive, adequate treatment w/ PCN Anticipated MOD:  NSVD  Meriam Sprague, MSN 05/07/2019 10:05 PM

## 2019-05-07 NOTE — H&P (Signed)
OB ADMISSION/ HISTORY & PHYSICAL:  Admission Date: 05/07/2019 11:19 AM  Admit Diagnosis: SROM  Hannah Morton is a 29 y.o. female [phone] [redacted]w[redacted]d presenting for LOF @ 1045. Endorses active FM denies vaginal bleeding.   History of current pregnancy: [id number]   Patient entered care with [facility] at 11 wks.   EDC of 05/14/19 was established by LMP and congruent w/ 11+0 wk U/S.   Anatomy scan:  23+6 wks, with normal findings and ANTERIOR RIGHT ACCESSORY LOBE on a posterior placenta.   Antenatal testing: for Placenta succenturiata started growth scan everyt 4 weeksweeks Last evaluation: 34+1 wks  AFI 12.5, EFW 5+1, 37%ILE Significant prenatal events:  Patient Active Problem List   Diagnosis Date Noted  . Delayed delivery after SROM (spontaneous rupture of membranes) 05/07/2019  . 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 Chest pain with low risk for cardiac etiology 07/13/2017  . Arthralgia of both knees 07/13/2017  . Stress 07/13/2017    Prenatal Labs: ABO, Rh: --/--/A POS, A POS Performed at [facility], [address] [place], [place] [address]  [id number]/23 1251) Antibody: NEG (02/23 1251) Rubella:   immune RPR:   non-reactive HBsAg:   negative HIV:   non-reactive GTT: passed GBS:   positive GC/CHL: neg/neg Genetics: low risk female, AFP normal   OB History  Gravida Para Term Preterm AB Living  4 2 2   1 2   SAB TAB Ectopic Multiple Live Births    1     2    # Outcome Date GA Lbr Len/2nd Weight Sex Delivery Anes PTL Lv  4 Current           3 Term 09/04/13 [redacted]w[redacted]d 08:30 / 00:09 2460 g M Vag-Spont EPI  LIV  2 Term 04/2009 [redacted]w[redacted]d  2608 g M Vag-Spont EPI  LIV  1 TAB 2009            Medical / Surgical History: Past medical history:  Past Medical History:  Diagnosis Date  . Frequent headaches   . Migraines   . Obesity (BMI 35.0-39.9 without comorbidity)     Past surgical history: History reviewed. No pertinent surgical history. Family History:  Family History  Problem  Relation Age of Onset  . Alcohol abuse Mother   . COPD Mother   . Hypertension Mother   . Diabetes Father   . Alcohol abuse Brother   . Hypertension Brother   . Arthritis Maternal Grandmother   . Asthma Maternal Grandmother   . Birth defects Maternal Grandmother   . Cancer Maternal Grandmother   . COPD Maternal Grandmother   . Diabetes Maternal Grandmother   . Heart disease Maternal Grandmother   . Hyperlipidemia Maternal Grandmother   . Stroke Maternal Grandfather   . Stroke Paternal Grandfather   . Heart attack Paternal Grandfather     Social History:  reports that she has never smoked. She has never used smokeless tobacco. She reports current alcohol use of about 2.0 standard drinks of alcohol per week. She reports that she does not use drugs.  Allergies: Codeine   Current Medications at time of admission:  Prior to Admission medications   Medication Sig Start Date End Date Taking? Authorizing Provider  loratadine (CLARITIN) 10 MG tablet Take 10 mg by mouth daily as needed for allergies.    [provider]  Prenatal Vit-Fe Fumarate-FA (PRENATAL MULTIVITAMIN) TABS tablet Take 1 tablet by mouth daily at 12 noon.    [provider]    Review  of Systems: Constitutional: Negative   HENT: Negative   Eyes: Negative   Respiratory: Negative   Cardiovascular: Negative   Gastrointestinal: Negative  Genitourinary: negative for bloody show, positive for LOF   Musculoskeletal: Negative   Skin: Negative   Neurological: Negative   Endo/Heme/Allergies: Negative   Psychiatric/Behavioral: Negative    Physical Exam: VS: Blood pressure 120/80, pulse 97, temperature 98 F (36.7 C), temperature source Oral, resp. rate 16, height 5' (1.524 m), weight 95.5 kg, last menstrual period 08/07/2018, SpO2 99 %, unknown if currently breastfeeding. AAO x3, no signs of distress Cardiovascular: RRR Respiratory: Lung fields clear to ausculation GU/GI: Abdomen gravid, non-tender,  non-distended, active FM, vertex, EFW 7# 6 oz per Leopold's Extremities: no edema, negative for pain, tenderness, and cords  Cervical exam:Dilation: 1 Effacement (%): 50 Station: -3 Exam by:: Burman Foster CNM FHR: baseline rate 150 / variability moderate / accelerations present / absent decelerations TOCO: irreg   Prenatal Transfer Tool  Maternal Diabetes: No Genetic Screening: Normal Maternal Ultrasounds/Referrals: Normal Fetal Ultrasounds or other Referrals:  None Maternal Substance Abuse:  No Significant Maternal Medications:  None Significant Maternal Lab Results: Group B Strep positive    Assessment: 29 y.o. [id number] [redacted]w[redacted]d  Latent stage of labor SROM FHR category 1 GBS positive Pain management plan: Epidural   Plan:  Admit to L&D Routine admission orders PCN for GBS prophylaxis Epidural PRN  Dr Alesia Richards notified of admission and plan of care  Arrie Eastern CNM, MSN 05/07/2019 12:59 PM

## 2019-05-08 ENCOUNTER — Encounter (HOSPITAL_COMMUNITY): Payer: Self-pay | Admitting: Obstetrics & Gynecology

## 2019-05-08 LAB — CBC
HCT: 36.2 % (ref 36.0–46.0)
Hemoglobin: 12 g/dL (ref 12.0–15.0)
MCH: 27.5 pg (ref 26.0–34.0)
MCHC: 33.1 g/dL (ref 30.0–36.0)
MCV: 82.8 fL (ref 80.0–100.0)
Platelets: 157 10*3/uL (ref 150–400)
RBC: 4.37 MIL/uL (ref 3.87–5.11)
RDW: 14.4 % (ref 11.5–15.5)
WBC: 19.6 10*3/uL — ABNORMAL HIGH (ref 4.0–10.5)
nRBC: 0 % (ref 0.0–0.2)

## 2019-05-08 LAB — RPR: RPR Ser Ql: NONREACTIVE

## 2019-05-08 MED ORDER — SENNOSIDES-DOCUSATE SODIUM 8.6-50 MG PO TABS
2.0000 | ORAL_TABLET | ORAL | Status: DC
  Administered 2019-05-09: 2 via ORAL
  Filled 2019-05-08: qty 2

## 2019-05-08 MED ORDER — ONDANSETRON HCL 4 MG/2ML IJ SOLN: 4.0000 mg | INTRAMUSCULAR | Status: DC | PRN

## 2019-05-08 MED ORDER — PRENATAL MULTIVITAMIN CH
1.0000 | ORAL_TABLET | Freq: Every day | ORAL | Status: DC
  Administered 2019-05-08 – 2019-05-09 (×2): 1 via ORAL
  Filled 2019-05-08 (×2): qty 1

## 2019-05-08 MED ORDER — ONDANSETRON HCL 4 MG PO TABS: 4.0000 mg | ORAL_TABLET | ORAL | Status: DC | PRN

## 2019-05-08 MED ORDER — DIPHENHYDRAMINE HCL 25 MG PO CAPS: 25.0000 mg | ORAL_CAPSULE | Freq: Four times a day (QID) | ORAL | Status: DC | PRN

## 2019-05-08 MED ORDER — TETANUS-DIPHTH-ACELL PERTUSSIS 5-2.5-18.5 LF-MCG/0.5 IM SUSP: 0.5000 mL | Freq: Once | INTRAMUSCULAR | Status: DC

## 2019-05-08 MED ORDER — ACETAMINOPHEN 325 MG PO TABS: 650.0000 mg | ORAL_TABLET | ORAL | Status: DC | PRN

## 2019-05-08 MED ORDER — IBUPROFEN 600 MG PO TABS
600.0000 mg | ORAL_TABLET | Freq: Four times a day (QID) | ORAL | Status: DC
  Administered 2019-05-08 – 2019-05-09 (×4): 600 mg via ORAL
  Filled 2019-05-08 (×5): qty 1

## 2019-05-08 MED ORDER — DIBUCAINE (PERIANAL) 1 % EX OINT: 1.0000 "application " | TOPICAL_OINTMENT | CUTANEOUS | Status: DC | PRN

## 2019-05-08 MED ORDER — COCONUT OIL OIL: 1.0000 "application " | TOPICAL_OIL | Status: DC | PRN

## 2019-05-08 MED ORDER — SIMETHICONE 80 MG PO CHEW: 80.0000 mg | CHEWABLE_TABLET | ORAL | Status: DC | PRN

## 2019-05-08 MED ORDER — ZOLPIDEM TARTRATE 5 MG PO TABS: 5.0000 mg | ORAL_TABLET | Freq: Every evening | ORAL | Status: DC | PRN

## 2019-05-08 MED ORDER — WITCH HAZEL-GLYCERIN EX PADS: 1.0000 "application " | MEDICATED_PAD | CUTANEOUS | Status: DC | PRN

## 2019-05-08 MED ORDER — BENZOCAINE-MENTHOL 20-0.5 % EX AERO: 1.0000 "application " | INHALATION_SPRAY | CUTANEOUS | Status: DC | PRN

## 2019-05-08 NOTE — Progress Notes (Signed)
Patient ID: Hannah Morton, female   DOB: [birth year], 29 y.o.   MRN: [id number] INTERVAL NOTE: S/P NSVD  Live born female  Birth Weight: 5 lb 11.9 oz (2605 g) APGAR: 9, 9  Newborn Delivery   Birth date/time: 05/08/2019 00:50:00 Delivery type: Vaginal, Spontaneous     Baby name: [name] Delivering provider: Rhea Pink B  Episiotomy:None   Lacerations:None   S:  Sitting in bed, BFing, min cramping, (+) voids, small bleed, denies HA/NV/dizziness  O:   Vitals:   05/08/19 0315 05/08/19 0415 05/08/19 0507 05/08/19 0921  BP: 107/63 112/61 107/67 118/78  Pulse: 95 77 84 76  Resp: 18 18 18 18   Temp:   98.6 F (37 C) 98.3 F (36.8 C)  TempSrc:   Oral Oral  SpO2:  100% 100% 100%  Weight:      Height:         AAO x 3, NAD  FF bellow U  Scant lochia  A / P:    Principal Problem:   Postpartum care following vaginal delivery 4/24 Active Problems:   SVD (spontaneous vaginal delivery)   Delayed delivery after SROM (spontaneous rupture of membranes)   Placenta succenturiata  PPD #0  Stable post partum  Routine PP orders  Malyn Aytes, CNM, MSN  05/08/2019 10:27 AM

## 2019-05-08 NOTE — Anesthesia Postprocedure Evaluation (Signed)
Anesthesia Post Note  Patient: Kimiye N Fontes  Procedure(s) Performed: AN AD HOC LABOR EPIDURAL     Patient location during evaluation: Mother Baby Anesthesia Type: Epidural Level of consciousness: awake Pain management: satisfactory to patient Vital Signs Assessment: post-procedure vital signs reviewed and stable Respiratory status: spontaneous breathing Cardiovascular status: stable Anesthetic complications: no    Last Vitals:  Vitals:   05/08/19 0507 05/08/19 0921  BP: 107/67 118/78  Pulse: 84 76  Resp: 18 18  Temp: 37 C 36.8 C  SpO2: 100% 100%    Last Pain:  Vitals:   05/08/19 0921  TempSrc: Oral  PainSc: 6    Pain Goal: Patients Stated Pain Goal: 9 (05/07/19 1926)                 Liliani Bobo     

## 2019-05-08 NOTE — Anesthesia Postprocedure Evaluation (Signed)
Anesthesia Post Note  Patient: Hannah Morton  Procedure(s) Performed: AN AD HOC LABOR EPIDURAL     Patient location during evaluation: Mother Baby Anesthesia Type: Epidural Level of consciousness: awake Pain management: satisfactory to patient Vital Signs Assessment: post-procedure vital signs reviewed and stable Respiratory status: spontaneous breathing Cardiovascular status: stable Anesthetic complications: no    Last Vitals:  Vitals:   05/08/19 0507 05/08/19 0921  BP: 107/67 118/78  Pulse: 84 76  Resp: 18 18  Temp: 37 C 36.8 C  SpO2: 100% 100%    Last Pain:  Vitals:   05/08/19 0921  TempSrc: Oral  PainSc: 6    Pain Goal: Patients Stated Pain Goal: 9 (05/07/19 1926)                 Cephus Shelling

## 2019-05-08 NOTE — Lactation Note (Signed)
This note was copied from a baby's chart. Lactation Consultation Note  Patient Name: Hannah Morton [id number]'X Date: 05/08/2019 Reason for consult: Initial assessment;Infant < 6lbs    P3 mom with bf exp. Of a couple of weeks with now 31 year old.   Desires to bf [name] and states she feels she has more knowledge and also more support.  Mom is struggling bf on right side.  Mom demonstrated hand expression.  Drops seen.  Collected 1 ml and finger fed to infant.    Infant seems "stuffy" and mom feels she is congested.  Mom did latch in football hold and infant sustained latch and was kept active at the breast with mom massaging and compressing breast.  Multiple swallows heard.  Mom was excited and LC pointed out the jaw movement and the continual sucking that mom would want to watch for during the feed as well as listen for swallows.  Infant self detached after 15 minutes and was placed on other side to feed. Moms nipple was rounded she was comfortable during feeding. Mom hand exp. And latched baby with more ease on left side.  Swallows heard again and arms were more relaxed with second feed.  Infant feeding when LC left room.    LC discussed hand expression and feeding back EBM to infant with spoon or finger.  Colostrum container given.  Mom knows to hand exp. Prior to and after feeding.    BF basics reviewed; feeds 8-12X in 24hours, STS, feeding cues and feeding on demand discussed.    Resources for family were shared.  Lactation brochure given.   Pt. Aware of OP services and phone line.    Maternal Data Has patient been taught Hand Expression?: Yes Does the patient have breastfeeding experience prior to this delivery?: Yes  Feeding Feeding Type: Breast Fed  LATCH Score Latch: Repeated attempts needed to sustain latch, nipple held in mouth throughout feeding, stimulation needed to elicit sucking reflex.  Audible Swallowing: A few with stimulation  Type of Nipple: Everted at rest  and after stimulation  Comfort (Breast/Nipple): Soft / non-tender  Hold (Positioning): Assistance needed to correctly position infant at breast and maintain latch.  LATCH Score: 7  Interventions Interventions: Breast feeding basics reviewed;Assisted with latch;Skin to skin;Breast massage;Hand express;Expressed milk;Position options;Support pillows;Adjust position;Breast compression  Lactation Tools Discussed/Used     Consult Status Consult Status: Follow-up Date: 05/09/19 Follow-up type: In-patient    Maryruth Hancock [facility] 05/08/2019, 5:50 PM

## 2019-05-09 LAB — SURGICAL PATHOLOGY

## 2019-05-09 NOTE — Lactation Note (Signed)
This note was copied from a baby's chart. Lactation Consultation Note  Patient Name: Hannah Morton [id number] Date: 05/09/2019 Reason for consult: Follow-up assessment  P3 mother whose infant is now 19 hours old.    Baby was swaddled and asleep in mother's arms when I arrived.  She had recently breast fed.  Mother had no questions/concerns related to breast feeding and feels like baby is latching well.  Mother will continue practicing hand expression before/after feedings to help increase milk supply.  Mother has used some donor breast milk as a supplement to breast feeding.    Engorgement prevention/treatment reviewed.  Mother has a manual pump but does not have a DEBP for home use.  She was supposed to have a [facility] appointment today.  Offered to fax a referral for a DEBP and mother accepted.  She will make a follow up call this a.m. to determine eligibility.  She has our OP phone number for questions after discharge.  No support person present at this time.   Maternal Data    Feeding Feeding Type: Breast Fed  LATCH Score Latch: Grasps breast easily, tongue down, lips flanged, rhythmical sucking.  Audible Swallowing: A few with stimulation  Type of Nipple: Everted at rest and after stimulation  Comfort (Breast/Nipple): Soft / non-tender  Hold (Positioning): No assistance needed to correctly position infant at breast.  LATCH Score: 9  Interventions Interventions: Assisted with latch;Hand express;Support pillows  Lactation Tools Discussed/Used     Consult Status Consult Status: Complete Date: 05/09/19 Follow-up type: Call as needed    [name] 05/09/2019, 9:48 AM

## 2019-05-09 NOTE — Discharge Instructions (Signed)

## 2019-05-09 NOTE — Discharge Summary (Signed)
OB Discharge Summary     Patient Name: Hannah Morton DOB: 1991/03/13 MRN: [id number]  Date of admission: 05/07/2019 Delivering MD: Rhea Pink B   Date of discharge: 05/09/2019  Admitting diagnosis: Delayed delivery after SROM (spontaneous rupture of membranes) [O42.90] Intrauterine pregnancy: [redacted]w[redacted]d     Secondary diagnosis:  Principal Problem:   Postpartum care following vaginal delivery 4/24 Active Problems:   SVD (spontaneous vaginal delivery)   Delayed delivery after SROM (spontaneous rupture of membranes)   Placenta succenturiata  Additional problems: None     Discharge diagnosis: Term Pregnancy Delivered                                                                                                Post partum procedures:None  Augmentation: Pitocin  Complications: None  Hospital course:  Onset of Labor With Vaginal Delivery     29 y.o. yo [id number] at [redacted]w[redacted]d was admitted in Latent Labor on 05/07/2019. Patient had an uncomplicated labor course as follows:  Membrane Rupture Time/Date: 3:00 PM ,05/08/2019   Intrapartum Procedures: Episiotomy: None [1]                                         Lacerations:  None [1]  Patient had a delivery of a Viable infant. 05/08/2019  Information for the patient's newborn:  Greyson, Riccardi Girl [place] [[id number]]       Pateint had an uncomplicated postpartum course.  She is ambulating, tolerating a regular diet, passing flatus, and urinating well. Patient is discharged home in stable condition on 05/09/19.   Physical exam  Vitals:   05/08/19 0921 05/08/19 1500 05/08/19 2346 05/09/19 0525  BP: 118/78 113/69 126/79 124/66  Pulse: 76 77 76 79  Resp: 18  18 16   Temp: 98.3 F (36.8 C) 98.1 F (36.7 C) 98 F (36.7 C) 97.8 F (36.6 C)  TempSrc: Oral Oral Oral Oral  SpO2: 100% 100% 100% 98%  Weight:      Height:       General: alert, cooperative and no distress Lochia: appropriate Uterine Fundus: firm Incision: N/A DVT Evaluation:  No evidence of DVT seen on physical exam. Labs: Lab Results  Component Value Date   WBC 19.6 (H) 05/08/2019   HGB 12.0 05/08/2019   HCT 36.2 05/08/2019   MCV 82.8 05/08/2019   PLT 157 05/08/2019   CMP Latest Ref Rng & Units 07/14/2017  Glucose 70 - 99 mg/dL 91  BUN 6 - 23 mg/dL 13  Creatinine 09/13/2017 - 2.35 mg/dL 5.73  Sodium 2.20 - 254 mEq/L 137  Potassium 3.5 - 5.1 mEq/L 4.2  Chloride 96 - 112 mEq/L 105  CO2 19 - 32 mEq/L 25  Calcium 8.4 - 10.5 mg/dL 9.1  Total Protein 6.0 - 8.3 g/dL 7.0  Total Bilirubin 0.2 - 1.2 mg/dL 0.4  Alkaline Phos 39 - 117 U/L 45  AST 0 - 37 U/L 12  ALT 0 - 35 U/L 10    Discharge instruction: per After Visit Summary  and "Baby and Me Booklet".  After visit meds:  Allergies as of 05/09/2019      Reactions   Codeine Rash      Medication List    TAKE these medications   calcium carbonate 500 MG chewable tablet Commonly known as: TUMS - dosed in mg elemental calcium Chew 1 tablet by mouth 2 (two) times daily as needed for indigestion or heartburn.   carboxymethylcellulose 0.5 % Soln Commonly known as: REFRESH PLUS Place 1 drop into both eyes 3 (three) times daily as needed (For dryness).   prenatal multivitamin Tabs tablet Take 1 tablet by mouth daily at 12 noon.       Diet: routine diet  Activity: Advance as tolerated. Pelvic rest for 6 weeks.   Outpatient follow up:6 weeks Follow up Appt:No future appointments. Follow up Visit:No follow-ups on file.  Postpartum contraception: IUD Mirena  Newborn Data: Live born female  Birth Weight: 5 lb 11.9 oz (2605 g) APGAR: 33, 9  Newborn Delivery   Birth date/time: 05/08/2019 00:50:00 Delivery type: Vaginal, Spontaneous      Baby Feeding: Breast Disposition:home with mother   05/09/2019 Starla Link, CNM

## 2019-05-27 ENCOUNTER — Emergency Department (HOSPITAL_COMMUNITY): Payer: Medicaid Other

## 2019-05-27 ENCOUNTER — Emergency Department (HOSPITAL_COMMUNITY)
Admission: EM | Admit: 2019-05-27 | Discharge: 2019-05-27 | Disposition: A | Discharge status: Home / Self Care | Payer: Medicaid Other | Attending: Emergency Medicine | Admitting: Emergency Medicine

## 2019-05-27 ENCOUNTER — Other Ambulatory Visit: Payer: Medicaid Other

## 2019-05-27 ENCOUNTER — Other Ambulatory Visit: Payer: Self-pay

## 2019-05-27 ENCOUNTER — Encounter (HOSPITAL_COMMUNITY): Payer: Self-pay

## 2019-05-27 DIAGNOSIS — R05 Cough: Secondary | ICD-10-CM | POA: Diagnosis present

## 2019-05-27 DIAGNOSIS — J069 Acute upper respiratory infection, unspecified: Secondary | ICD-10-CM | POA: Insufficient documentation

## 2019-05-27 DIAGNOSIS — Z20822 Contact with and (suspected) exposure to covid-19: Secondary | ICD-10-CM | POA: Diagnosis not present

## 2019-05-27 DIAGNOSIS — R059 Cough, unspecified: Secondary | ICD-10-CM

## 2019-05-27 LAB — CBC WITH DIFFERENTIAL/PLATELET
Abs Immature Granulocytes: 0.05 10*3/uL (ref 0.00–0.07)
Basophils Absolute: 0 10*3/uL (ref 0.0–0.1)
Basophils Relative: 0 %
Eosinophils Absolute: 0.2 10*3/uL (ref 0.0–0.5)
Eosinophils Relative: 2 %
HCT: 40.9 % (ref 36.0–46.0)
Hemoglobin: 13 g/dL (ref 12.0–15.0)
Immature Granulocytes: 0 %
Lymphocytes Relative: 16 %
Lymphs Abs: 2.1 10*3/uL (ref 0.7–4.0)
MCH: 26.9 pg (ref 26.0–34.0)
MCHC: 31.8 g/dL (ref 30.0–36.0)
MCV: 84.5 fL (ref 80.0–100.0)
Monocytes Absolute: 1 10*3/uL (ref 0.1–1.0)
Monocytes Relative: 7 %
Neutro Abs: 9.7 10*3/uL — ABNORMAL HIGH (ref 1.7–7.7)
Neutrophils Relative %: 75 %
Platelets: 288 10*3/uL (ref 150–400)
RBC: 4.84 MIL/uL (ref 3.87–5.11)
RDW: 13.8 % (ref 11.5–15.5)
WBC: 13.1 10*3/uL — ABNORMAL HIGH (ref 4.0–10.5)
nRBC: 0 % (ref 0.0–0.2)

## 2019-05-27 LAB — URINALYSIS, ROUTINE W REFLEX MICROSCOPIC
Bilirubin Urine: NEGATIVE
Glucose, UA: NEGATIVE mg/dL
Ketones, ur: NEGATIVE mg/dL
Leukocytes,Ua: NEGATIVE
Nitrite: NEGATIVE
Protein, ur: NEGATIVE mg/dL
Specific Gravity, Urine: 1.003 — ABNORMAL LOW (ref 1.005–1.030)
pH: 6 (ref 5.0–8.0)

## 2019-05-27 LAB — BASIC METABOLIC PANEL
Anion gap: 10 (ref 5–15)
BUN: 8 mg/dL (ref 6–20)
CO2: 24 mmol/L (ref 22–32)
Calcium: 8.2 mg/dL — ABNORMAL LOW (ref 8.9–10.3)
Chloride: 101 mmol/L (ref 98–111)
Creatinine, Ser: 0.7 mg/dL (ref 0.44–1.00)
GFR calc Af Amer: >60 mL/min (ref >60)
GFR calc non Af Amer: >60 mL/min (ref >60)
Glucose, Bld: 123 mg/dL — ABNORMAL HIGH (ref 70–99)
Potassium: 3.3 mmol/L — ABNORMAL LOW (ref 3.5–5.1)
Sodium: 135 mmol/L (ref 135–145)

## 2019-05-27 LAB — POC SARS CORONAVIRUS 2 AG -  ED: SARS Coronavirus 2 Ag: NEGATIVE

## 2019-05-27 LAB — D-DIMER, QUANTITATIVE: D-Dimer, Quant: 0.92 ug/mL-FEU — ABNORMAL HIGH (ref 0.00–0.50)

## 2019-05-27 LAB — SARS CORONAVIRUS 2 (TAT 6-24 HRS): SARS Coronavirus 2: NEGATIVE

## 2019-05-27 MED ORDER — SODIUM CHLORIDE (PF) 0.9 % IJ SOLN
INTRAMUSCULAR | Status: AC
  Filled 2019-05-27: qty 50

## 2019-05-27 MED ORDER — IOHEXOL 350 MG/ML SOLN
100.0000 mL | Freq: Once | INTRAVENOUS | Status: AC | PRN
  Administered 2019-05-27: 100 mL via INTRAVENOUS

## 2019-05-27 MED ORDER — SODIUM CHLORIDE 0.9 % IV BOLUS
1000.0000 mL | Freq: Once | INTRAVENOUS | Status: AC
  Administered 2019-05-27: 1000 mL via INTRAVENOUS

## 2019-05-27 NOTE — Discharge Instructions (Addendum)
Discussed with pharmacy to discuss over the counter meds safe for lactation. Follow up with Obgyn or PCP in 2 day for re-evaluation. Return for new or worsening symptoms.  You will be called if your COVID test is positive.

## 2019-05-27 NOTE — ED Provider Notes (Signed)
[facility] DEPT Provider Note   CSN: [id number] Arrival date & time: 05/27/19  0735   History Chief Complaint  Patient presents with  . Cough  . Generalized Body Aches  . Shortness of Breath   Hannah Morton is a 29 y.o. female with past medical history significant for recent pregnancy who presents for evaluation of multiple complaints.  Patient states she has been having productive cough, shortness of breath, intermittent chest pain when she coughs, night sweats, 2 episodes of diarrhea over the last 3 days.  No recent Covid exposures however states her son has similar symptoms.  She has not taken anything for her symptoms. Tolerating PO intake at home. Has not taken temp. GBS positive with ABX at delivery. Denies HA, vision changes, N/V, abd pain, pelvic pain, vaginal discharge, dysuria, unilateral leg swelling, redness, warmth. No prior hx of PE. DVT. Denies additional aggravating or alleviating factors.  History obtained from patient and past medical records. No interpretor was used.    HPI     Past Medical History:  Diagnosis Date  . Frequent headaches   . Migraines   . Obesity (BMI 35.0-39.9 without comorbidity)     Patient Active Problem List   Diagnosis Date Noted  . Postpartum care following vaginal delivery 4/24 05/08/2019  . Delayed delivery after SROM (spontaneous rupture of membranes) 05/07/2019  . 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 SVD (spontaneous vaginal delivery) 09/04/2013    History reviewed. No pertinent surgical history.   OB History    Gravida  4   Para  3   Term  3   Preterm      AB  1   Living  3     SAB      TAB  1   Ectopic      Multiple  0   Live Births  3           Family History  Problem Relation Age of Onset  . Alcohol abuse Mother   . COPD Mother   . Hypertension Mother   . Diabetes Father   . Alcohol abuse Brother   . Hypertension Brother   . Arthritis Maternal Grandmother     . Asthma Maternal Grandmother   . Birth defects Maternal Grandmother   . Cancer Maternal Grandmother   . COPD Maternal Grandmother   . Diabetes Maternal Grandmother   . Heart disease Maternal Grandmother   . Hyperlipidemia Maternal Grandmother   . Stroke Maternal Grandfather   . Stroke Paternal Grandfather   . Heart attack Paternal Grandfather     Social History   Tobacco Use  . Smoking status: Never Smoker  . Smokeless tobacco: Never Used  . Tobacco comment: Only when drinking  Substance Use Topics  . Alcohol use: Yes    Alcohol/week: 2.0 standard drinks    Types: 2 Standard drinks or equivalent per week  . Drug use: No    Home Medications Prior to Admission medications   Medication Sig Start Date End Date Taking? Authorizing Provider  calcium carbonate (TUMS - DOSED IN MG ELEMENTAL CALCIUM) 500 MG chewable tablet Chew 1 tablet by mouth 2 (two) times daily as needed for indigestion or heartburn.    [provider]  carboxymethylcellulose (REFRESH PLUS) 0.5 % SOLN Place 1 drop into both eyes 3 (three) times daily as needed (For dryness).    [provider]  Prenatal Vit-Fe Fumarate-FA (PRENATAL MULTIVITAMIN) TABS tablet Take 1 tablet by mouth daily at  12 noon.    [provider]    Allergies    Codeine  Review of Systems   Review of Systems  Constitutional: Positive for chills. Negative for activity change, appetite change, diaphoresis, fatigue and fever.  HENT: Negative.   Respiratory: Positive for shortness of breath (INtermittent). Negative for apnea, cough (No hemoptysis), choking, chest tightness, wheezing and stridor.   Cardiovascular: Positive for chest pain (When coughing). Negative for palpitations and leg swelling.  Gastrointestinal: Positive for diarrhea (2 episodes without melena or BRBPR). Negative for abdominal distention, abdominal pain, anal bleeding, blood in stool, constipation, nausea, rectal pain and vomiting.  Genitourinary:  Negative.   Musculoskeletal: Negative.   Skin: Negative.   Neurological: Negative.   All other systems reviewed and are negative.  Physical Exam Updated Vital Signs BP 124/82   Pulse 85   Temp 99.7 F (37.6 C) (Oral)   Resp 19   LMP 08/07/2018   SpO2 100%   Physical Exam Vitals and nursing note reviewed.  Constitutional:      General: She is not in acute distress.    Appearance: She is not ill-appearing, toxic-appearing or diaphoretic.  HENT:     Head: Normocephalic and atraumatic.     Jaw: There is normal jaw occlusion.     Right Ear: Tympanic membrane, ear canal and external ear normal. There is no impacted cerumen. No hemotympanum. Tympanic membrane is not injected, scarred, perforated, erythematous, retracted or bulging.     Left Ear: Tympanic membrane, ear canal and external ear normal. There is no impacted cerumen. No hemotympanum. Tympanic membrane is not injected, scarred, perforated, erythematous, retracted or bulging.     Ears:     Comments: No Mastoid tenderness.    Nose:     Comments: Clear rhinorrhea and congestion to bilateral nares.  No sinus tenderness.    Mouth/Throat:     Mouth: Mucous membranes are moist.     Pharynx: Oropharynx is clear.     Comments: Posterior oropharynx clear.  Mucous membranes moist.  Tonsils without erythema or exudate.  Uvula midline without deviation.  No evidence of PTA or RPA.  No drooling, dysphasia or trismus.  Phonation normal. Neck:     Trachea: Trachea and phonation normal.     Comments: No Neck stiffness or neck rigidity.  No meningismus.  No cervical lymphadenopathy. Cardiovascular:     Comments: No murmurs rubs or gallops. HR 100 in room Pulmonary:     Comments: Clear to auscultation bilaterally without wheeze, rhonchi or rales.  No accessory muscle usage.  Able speak in full sentences. Abdominal:     Comments: Soft, nontender without rebound or guarding.  No CVA tenderness.  Musculoskeletal:     Cervical back: Full  passive range of motion without pain, normal range of motion and neck supple.     Comments: Moves all 4 extremities without difficulty.  Lower extremities without edema, erythema or warmth.  Skin:    Comments: Brisk capillary refill.  No rashes or lesions.  Neurological:     Mental Status: She is alert.     Comments: Ambulatory in department without difficulty.  Cranial nerves II through XII grossly intact.  No facial droop.  No aphasia.    ED Results / Procedures / Treatments   Labs (all labs ordered are listed, but only abnormal results are displayed) Labs Reviewed  CBC WITH DIFFERENTIAL/PLATELET - Abnormal; Notable for the following components:      Result Value   WBC 13.1 (*)  Neutro Abs 9.7 (*)    All other components within normal limits  BASIC METABOLIC PANEL - Abnormal; Notable for the following components:   Potassium 3.3 (*)    Glucose, Bld 123 (*)    Calcium 8.2 (*)    All other components within normal limits  D-DIMER, QUANTITATIVE (NOT AT [facility] - Abnormal; Notable for the following components:   D-Dimer, Quant 0.92 (*)    All other components within normal limits  URINALYSIS, ROUTINE W REFLEX MICROSCOPIC - Abnormal; Notable for the following components:   Specific Gravity, Urine 1.003 (*)    Hgb urine dipstick SMALL (*)    Bacteria, UA RARE (*)    All other components within normal limits  SARS CORONAVIRUS 2 (TAT 6-24 HRS)  POC SARS CORONAVIRUS 2 AG -  ED    EKG EKG Interpretation  Date/Time:  Monday May 27 2019 07:45:13 EDT Ventricular Rate:  106 PR Interval:    QRS Duration: 78 QT Interval:  318 QTC Calculation: 423 R Axis:   52 Text Interpretation: Sinus tachycardia Confirmed by Margarita Grizzle [phone]) on 05/27/2019 7:51:05 AM   Radiology CT Angio Chest PE W/Cm &/Or Wo Cm  Result Date: 05/27/2019 CLINICAL DATA:  Shortness of breath and chest pain. Clinical suspicion for pulmonary embolism. EXAM: CT ANGIOGRAPHY CHEST WITH CONTRAST TECHNIQUE:  Multidetector CT imaging of the chest was performed using the standard protocol during bolus administration of intravenous contrast. Multiplanar CT image reconstructions and MIPs were obtained to evaluate the vascular anatomy. CONTRAST:  OMNIPAQUE IOHEXOL 350 MG/ML SOLN COMPARISON:  01/29/2017 FINDINGS: Cardiovascular: Satisfactory opacification of pulmonary arteries noted, and no pulmonary emboli identified. No evidence of thoracic aortic dissection or aneurysm. Mediastinum/Nodes: No pathologically enlarged lymph nodes or other masses identified. Lungs/Pleura: Mild patchy ground-glass opacities are seen in the peripheral lung zones bilaterally, suspicious for atypical infectious or inflammatory process. No evidence of pulmonary consolidation or mass. No evidence of pleural effusion. Upper abdomen: No acute findings. Musculoskeletal: No suspicious bone lesions identified. Review of the MIP images confirms the above findings. IMPRESSION: 1. No evidence of pulmonary embolism. 2. Mild patchy ground-glass opacities in peripheral lung zones, suspicious for atypical infectious or inflammatory process. Electronically Signed   By: Danae Orleans M.D.   On: 05/27/2019 09:43   DG Chest Port 1 View  Result Date: 05/27/2019 CLINICAL DATA:  Cough, night sweats EXAM: PORTABLE CHEST 1 VIEW COMPARISON:  07/07/2017 chest radiograph. FINDINGS: Stable cardiomediastinal silhouette with normal heart size. No pneumothorax. No pleural effusion. Lungs appear clear, with no acute consolidative airspace disease and no pulmonary edema. IMPRESSION: No active disease. Electronically Signed   By: Delbert Phenix M.D.   On: 05/27/2019 08:40    Procedures Procedures (including critical care time)  Medications Ordered in ED Medications  sodium chloride 0.9 % bolus 1,000 mL (1,000 mLs Intravenous New Bag/Given 05/27/19 0808)  sodium chloride (PF) 0.9 % injection (  Given by Other 05/27/19 0953)  iohexol (OMNIPAQUE) 350 MG/ML injection  100 mL (100 mLs Intravenous Contrast Given 05/27/19 2703)    ED Course  I have reviewed the triage vital signs and the nursing notes.  Pertinent labs & imaging results that were available during my care of the patient were reviewed by me and considered in my medical decision making (see chart for details).  29 year old presents for evaluation of multiple complaints.  She is afebrile, nonseptic, not ill-appearing.  Patient with cough, body aches and pains, intermittent shortness of breath.  She does  get chest pain when she coughs however is not pleuritic and not exertional in nature.  No evidence of DVT on exam.  Prior history of PE or DVT.  Unfortunately cannot PERC due to tachycardia. Low suspicion for ACS, myocarditis, pneumothorax. Will obtain labs, imaging, EKG and reassess.  Labs and imaging personally reviewed and interpreted: CBC with mild leukocytosis at 13.1 BMP with mild hypokalemia, mildly elevated glucose at 123 Ddimer elevated at 0.92 UA negative for infection EKG with sinus tach, No EKG evidence of right heart strain. No STEMI POC COVID negative DG chest without infiltrates, pneumothorax, pulmonary edema, cardiomegally  5697: Patient reassessed. Denies CP, SOB. Ambulatory in room without hypoxia. CTA Chest without PE. bilat ground glass opacities suspicion for viral vs atypical PNA. Rapid COVID negative. Will order send out test. Shared decision making. Defer on ABX given likely viral PNA She is to FU with PCP ion 2 days for reevaluation. Return for new or worsening symptoms.  Low suspicion for ACS, PE, dissection, myocarditis, PP cardiomyopathy, bacterial infectious process.  The patient has been appropriately medically screened and/or stabilized in the ED. I have low suspicion for any other emergent medical condition which would require further screening, evaluation or treatment in the ED or require inpatient management.  Patient is hemodynamically stable and in no acute  distress.  Patient able to ambulate in department prior to ED.  Evaluation does not show acute pathology that would require ongoing or additional emergent interventions while in the emergency department or further inpatient treatment.  I have discussed the diagnosis with the patient and answered all questions.  Pain is been managed while in the emergency department and patient has no further complaints prior to discharge.  Patient is comfortable with plan discussed in room and is stable for discharge at this time.  I have discussed strict return precautions for returning to the emergency department.  Patient was encouraged to follow-up with PCP/specialist refer to at discharge.     MDM Rules/Calculators/A&P                      Hannah Morton was evaluated in Emergency Department on 05/27/2019 for the symptoms described in the history of present illness. She was evaluated in the context of the global COVID-19 pandemic, which necessitated consideration that the patient might be at risk for infection with the SARS-CoV-2 virus that causes COVID-19. Institutional protocols and algorithms that pertain to the evaluation of patients at risk for COVID-19 are in a state of rapid change based on information released by regulatory bodies including the CDC and federal and state organizations. These policies and algorithms were followed during the patient's care in the ED. Final Clinical Impression(s) / ED Diagnoses Final diagnoses:  Viral upper respiratory tract infection  COVID-19 virus test result unknown    Rx / DC Orders ED Discharge Orders    None       Alessandra Sawdey A, PA-C 05/27/19 9480    Margarita Grizzle, MD 05/28/19 1136

## 2019-05-27 NOTE — ED Triage Notes (Signed)
Patients reports chills, night sweats, diarrhea, productive cough, chest pressure while laying down, and body aches that started Friday night.   Patient reports she was going to get a covid test but decided to come here.   A/Ox4 Ambulatory in triage.   Denies pain at this time Patient denies shob at this time.  Patient is in no obvious distress as this time and able to speak incomplete sentences.

## 2019-06-09 ENCOUNTER — Other Ambulatory Visit: Payer: Self-pay

## 2019-06-09 ENCOUNTER — Emergency Department (HOSPITAL_COMMUNITY)
Admission: EM | Admit: 2019-06-09 | Discharge: 2019-06-09 | Disposition: A | Discharge status: Home / Self Care | Payer: Medicaid Other | Attending: Emergency Medicine | Admitting: Emergency Medicine

## 2019-06-09 ENCOUNTER — Encounter (HOSPITAL_COMMUNITY): Payer: Self-pay | Admitting: Emergency Medicine

## 2019-06-09 DIAGNOSIS — R1011 Right upper quadrant pain: Secondary | ICD-10-CM | POA: Diagnosis not present

## 2019-06-09 DIAGNOSIS — R109 Unspecified abdominal pain: Secondary | ICD-10-CM | POA: Diagnosis present

## 2019-06-09 LAB — COMPREHENSIVE METABOLIC PANEL
ALT: 15 U/L (ref 0–44)
AST: 16 U/L (ref 15–41)
Albumin: 3.8 g/dL (ref 3.5–5.0)
Alkaline Phosphatase: 71 U/L (ref 38–126)
Anion gap: 10 (ref 5–15)
BUN: 14 mg/dL (ref 6–20)
CO2: 23 mmol/L (ref 22–32)
Calcium: 9 mg/dL (ref 8.9–10.3)
Chloride: 106 mmol/L (ref 98–111)
Creatinine, Ser: 0.7 mg/dL (ref 0.44–1.00)
GFR calc Af Amer: >60 mL/min (ref >60)
GFR calc non Af Amer: >60 mL/min (ref >60)
Glucose, Bld: 76 mg/dL (ref 70–99)
Potassium: 4.1 mmol/L (ref 3.5–5.1)
Sodium: 139 mmol/L (ref 135–145)
Total Bilirubin: 0.4 mg/dL (ref 0.3–1.2)
Total Protein: 7.5 g/dL (ref 6.5–8.1)

## 2019-06-09 LAB — CBC
HCT: 42 % (ref 36.0–46.0)
Hemoglobin: 13.5 g/dL (ref 12.0–15.0)
MCH: 27 pg (ref 26.0–34.0)
MCHC: 32.1 g/dL (ref 30.0–36.0)
MCV: 84 fL (ref 80.0–100.0)
Platelets: 92 10*3/uL — ABNORMAL LOW (ref 150–400)
RBC: 5 MIL/uL (ref 3.87–5.11)
RDW: 14.2 % (ref 11.5–15.5)
WBC: 8.3 10*3/uL (ref 4.0–10.5)
nRBC: 0 % (ref 0.0–0.2)

## 2019-06-09 LAB — I-STAT BETA HCG BLOOD, ED (MC, WL, AP ONLY): I-stat hCG, quantitative: <5 m[IU]/mL (ref <5)

## 2019-06-09 LAB — URINALYSIS, ROUTINE W REFLEX MICROSCOPIC
Bacteria, UA: NONE SEEN
Bilirubin Urine: NEGATIVE
Glucose, UA: NEGATIVE mg/dL
Ketones, ur: 20 mg/dL — AB
Leukocytes,Ua: NEGATIVE
Nitrite: NEGATIVE
Protein, ur: 100 mg/dL — AB
Specific Gravity, Urine: 1.016 (ref 1.005–1.030)
pH: 9 — ABNORMAL HIGH (ref 5.0–8.0)

## 2019-06-09 LAB — LIPASE, BLOOD: Lipase: 36 U/L (ref 11–51)

## 2019-06-09 MED ORDER — ALUM & MAG HYDROXIDE-SIMETH 200-200-20 MG/5ML PO SUSP
30.0000 mL | Freq: Once | ORAL | Status: AC
  Administered 2019-06-09: 30 mL via ORAL
  Filled 2019-06-09: qty 30

## 2019-06-09 MED ORDER — LIDOCAINE VISCOUS HCL 2 % MT SOLN
15.0000 mL | Freq: Once | OROMUCOSAL | Status: AC
  Administered 2019-06-09: 15 mL via ORAL
  Filled 2019-06-09: qty 15

## 2019-06-09 MED ORDER — SUCRALFATE 1 G PO TABS: 1.0000 g | ORAL_TABLET | Freq: Four times a day (QID) | ORAL | 0 refills | Status: DC | PRN

## 2019-06-09 NOTE — ED Triage Notes (Signed)
Per pt, states right abdominal pain since last night-pain with palpation-no N/V/D

## 2019-06-09 NOTE — ED Provider Notes (Signed)
[facility]-EMERGENCY DEPT [facility] Emergency Department Provider Note MRN:  [id number]  Arrival date & time: 06/09/19     Chief Complaint   Abdominal Pain   History of Present Illness   Hannah Morton is a 29 y.o. year-old female with a history of migraines presenting to the ED with chief complaint of abdominal pain.  Location: Right upper quadrant and epigastrium Duration: 24-hour Onset: Gradual Timing: Constant Description: Dull ache Severity: Mild to moderate Exacerbating/Alleviating Factors: None Associated Symptoms: None Pertinent Negatives: Denies fever, no vision change, no headache, no neck pain, no chest pain, no shortness of breath, no lower abdominal pain, no vaginal bleeding or discharge, no swelling.  Patient gave birth via uncomplicated vaginal delivery about 1 month ago.   Review of Systems  A complete 10 system review of systems was obtained and all systems are negative except as noted in the HPI and PMH.   Patient's Health History    Past Medical History:  Diagnosis Date  . Frequent headaches   . Migraines   . Obesity (BMI 35.0-39.9 without comorbidity)     History reviewed. No pertinent surgical history.  Family History  Problem Relation Age of Onset  . Alcohol abuse Mother   . COPD Mother   . Hypertension Mother   . Diabetes Father   . Alcohol abuse Brother   . Hypertension Brother   . Arthritis Maternal Grandmother   . Asthma Maternal Grandmother   . Birth defects Maternal Grandmother   . Cancer Maternal Grandmother   . COPD Maternal Grandmother   . Diabetes Maternal Grandmother   . Heart disease Maternal Grandmother   . Hyperlipidemia Maternal Grandmother   . Stroke Maternal Grandfather   . Stroke Paternal Grandfather   . Heart attack Paternal Grandfather     Social History   Socioeconomic History  . Marital status: Married    Spouse name: Not on file  . Number of children: Not on file  . Years of education: Not on file  .  Highest education level: Not on file  Occupational History  . Occupation: Center coordinator/CNA    Employer: [employer]  Tobacco Use  . Smoking status: Never Smoker  . Smokeless tobacco: Never Used  . Tobacco comment: Only when drinking  Substance and Sexual Activity  . Alcohol use: Yes    Alcohol/week: 2.0 standard drinks    Types: 2 Standard drinks or equivalent per week  . Drug use: No  . Sexual activity: Yes    Birth control/protection: None  Other Topics Concern  . Not on file  Social History Narrative   Patient is a married mother of 2 sons.   She works as a Lawyer.     She may have 1-2 alcoholic drinks a week, but never smoked.   She does try to go walking with her sons on occasion, but does not do routine exercise   Social Determinants of Health   Financial Resource Strain:   . Difficulty of Paying Living Expenses:   Food Insecurity:   . Worried About Programme researcher, broadcasting/film/video in the Last Year:   . Barista in the Last Year:   Transportation Needs:   . Freight forwarder (Medical):   Marland Kitchen Lack of Transportation (Non-Medical):   Physical Activity:   . Days of Exercise per Week:   . Minutes of Exercise per Session:   Stress:   . Feeling of Stress :   Social Connections:   .  Frequency of Communication with Friends and Family:   . Frequency of Social Gatherings with Friends and Family:   . Attends Religious Services:   . Active Member of Clubs or Organizations:   . Attends Banker Meetings:   Marland Kitchen Marital Status:   Intimate Partner Violence:   . Fear of Current or Ex-Partner:   . Emotionally Abused:   Marland Kitchen Physically Abused:   . Sexually Abused:      Physical Exam   Vitals:   06/09/19 1300 06/09/19 1330  BP: 119/83 120/79  Pulse: 76 (!) 58  Resp: 18 18  Temp:    SpO2: 100% 100%    CONSTITUTIONAL: Well-appearing, NAD NEURO:  Alert and oriented x 3, no focal deficits EYES:  eyes equal and reactive ENT/NECK:  no LAD, no JVD CARDIO:  Regular rate, well-perfused, normal S1 and S2 PULM:  CTAB no wheezing or rhonchi GI/GU:  normal bowel sounds, non-distended, non-tender MSK/SPINE:  No gross deformities, no edema SKIN:  no rash, atraumatic PSYCH:  Appropriate speech and behavior  *Additional and/or pertinent findings included in MDM below  Diagnostic and Interventional Summary    EKG Interpretation  Date/Time:    Ventricular Rate:    PR Interval:    QRS Duration:   QT Interval:    QTC Calculation:   R Axis:     Text Interpretation:        Labs Reviewed  CBC - Abnormal; Notable for the following components:      Result Value   Platelets 92 (*)    All other components within normal limits  URINALYSIS, ROUTINE W REFLEX MICROSCOPIC - Abnormal; Notable for the following components:   APPearance TURBID (*)    pH 9.0 (*)    Hgb urine dipstick MODERATE (*)    Ketones, ur 20 (*)    Protein, ur 100 (*)    All other components within normal limits  LIPASE, BLOOD  COMPREHENSIVE METABOLIC PANEL  I-STAT BETA HCG BLOOD, ED ([facility], [facility], [facility] ONLY)    No orders to display    Medications  alum & mag hydroxide-simeth (MAALOX/MYLANTA) 200-200-20 MG/5ML suspension 30 mL (30 mLs Oral Given 06/09/19 1234)    And  lidocaine (XYLOCAINE) 2 % viscous mouth solution 15 mL (15 mLs Oral Given 06/09/19 1234)     Procedures  /  Critical Care Procedures  ED Course and Medical Decision Making  I have reviewed the triage vital signs, the nursing notes, and pertinent available records from the EMR.  Pertinent labs & imaging results that were available during my care of the patient were reviewed by me and considered in my medical decision making (see below for details).     Suspect benign etiology of patient's pain such as GERD or constipation.  However will obtain screening labs to exclude help syndrome or late postpartum preeclampsia.  Would consider ultrasound of the right upper quadrant if labs are abnormal or patient develops a  worsened exam, currently without any tenderness.  Labs reveal downtrending platelets, protein in the urine.  However LFTs are normal and patient's blood pressures have been normal.  These findings discussed with GYN/obstetrics on call, not consistent with help syndrome or preeclampsia.  Patient continues to look and feel well, feeling better after GI cocktail, continues to have a nontender exam, no Murphy's sign, doubt gallbladder etiology, favoring GERD, appropriate for discharge.  Elmer Sow. Pilar Plate, MD [facility] Emergency Medicine [facility] [email or web address]  Final Clinical Impressions(s) / ED Diagnoses  ICD-10-CM   1. Right upper quadrant abdominal pain  R10.11     ED Discharge Orders         Ordered    sucralfate (CARAFATE) 1 g tablet  4 times daily PRN     06/09/19 1426           Discharge Instructions Discussed with and Provided to Patient:     Discharge Instructions     You were evaluated in the Emergency Department and after careful evaluation, we did not find any emergent condition requiring admission or further testing in the hospital.  Your exam/testing today is overall reassuring.  Your symptoms could be explained by some inflammation of the stomach or lower esophagus due to acid reflux.  Constipation may also be contributing.  Please continue taking MiraLAX and take the Carafate prescription provided as needed for discomfort.  Please return to the Emergency Department if you experience any worsening of your condition.  We encourage you to follow up with a primary care provider.  Thank you for allowing [place] to be a part of your care.       Maudie Flakes, MD 06/09/19 [phone]

## 2019-06-09 NOTE — Discharge Instructions (Addendum)
You were evaluated in the Emergency Department and after careful evaluation, we did not find any emergent condition requiring admission or further testing in the hospital.  Your exam/testing today is overall reassuring.  Your symptoms could be explained by some inflammation of the stomach or lower esophagus due to acid reflux.  Constipation may also be contributing.  Please continue taking MiraLAX and take the Carafate prescription provided as needed for discomfort.  Please return to the Emergency Department if you experience any worsening of your condition.  We encourage you to follow up with a primary care provider.  Thank you for allowing [place] to be a part of your care.

## 2019-06-10 ENCOUNTER — Other Ambulatory Visit: Payer: Self-pay

## 2019-06-10 ENCOUNTER — Encounter (HOSPITAL_COMMUNITY): Payer: Self-pay

## 2019-06-10 ENCOUNTER — Emergency Department (HOSPITAL_COMMUNITY)
Admission: EM | Admit: 2019-06-10 | Discharge: 2019-06-11 | Disposition: A | Discharge status: Home / Self Care | Payer: Medicaid Other | Attending: Emergency Medicine | Admitting: Emergency Medicine

## 2019-06-10 DIAGNOSIS — R1011 Right upper quadrant pain: Secondary | ICD-10-CM | POA: Diagnosis not present

## 2019-06-10 DIAGNOSIS — K8020 Calculus of gallbladder without cholecystitis without obstruction: Secondary | ICD-10-CM | POA: Diagnosis not present

## 2019-06-10 DIAGNOSIS — O99893 Other specified diseases and conditions complicating puerperium: Secondary | ICD-10-CM | POA: Diagnosis not present

## 2019-06-10 NOTE — ED Triage Notes (Signed)
Arrived POV from home. Patient reports worsening RUQ pain that has now started radiating into right lower and upper back. Patient seen here in this ED last night and was given GI coctail, which alleviated the pain for a few hours. Pain returned and pt went to urgent care. She was told by urgent care provider that pain is concerning for gall bladder.  patient endorses pain upon palpation of RUQ

## 2019-06-11 ENCOUNTER — Emergency Department (HOSPITAL_COMMUNITY): Payer: Medicaid Other

## 2019-06-11 LAB — COMPREHENSIVE METABOLIC PANEL
ALT: 59 U/L — ABNORMAL HIGH (ref 0–44)
AST: 41 U/L (ref 15–41)
Albumin: 3.9 g/dL (ref 3.5–5.0)
Alkaline Phosphatase: 80 U/L (ref 38–126)
Anion gap: 9 (ref 5–15)
BUN: 13 mg/dL (ref 6–20)
CO2: 27 mmol/L (ref 22–32)
Calcium: 9.2 mg/dL (ref 8.9–10.3)
Chloride: 102 mmol/L (ref 98–111)
Creatinine, Ser: 0.7 mg/dL (ref 0.44–1.00)
GFR calc Af Amer: >60 mL/min (ref >60)
GFR calc non Af Amer: >60 mL/min (ref >60)
Glucose, Bld: 85 mg/dL (ref 70–99)
Potassium: 3.5 mmol/L (ref 3.5–5.1)
Sodium: 138 mmol/L (ref 135–145)
Total Bilirubin: 0.6 mg/dL (ref 0.3–1.2)
Total Protein: 8 g/dL (ref 6.5–8.1)

## 2019-06-11 LAB — CBC WITH DIFFERENTIAL/PLATELET
Abs Immature Granulocytes: 0.01 10*3/uL (ref 0.00–0.07)
Basophils Absolute: 0 10*3/uL (ref 0.0–0.1)
Basophils Relative: 0 %
Eosinophils Absolute: 0.3 10*3/uL (ref 0.0–0.5)
Eosinophils Relative: 3 %
HCT: 43 % (ref 36.0–46.0)
Hemoglobin: 14 g/dL (ref 12.0–15.0)
Immature Granulocytes: 0 %
Lymphocytes Relative: 36 %
Lymphs Abs: 3.3 10*3/uL (ref 0.7–4.0)
MCH: 27.6 pg (ref 26.0–34.0)
MCHC: 32.6 g/dL (ref 30.0–36.0)
MCV: 84.6 fL (ref 80.0–100.0)
Monocytes Absolute: 0.6 10*3/uL (ref 0.1–1.0)
Monocytes Relative: 7 %
Neutro Abs: 4.9 10*3/uL (ref 1.7–7.7)
Neutrophils Relative %: 54 %
Platelets: 288 10*3/uL (ref 150–400)
RBC: 5.08 MIL/uL (ref 3.87–5.11)
RDW: 14.2 % (ref 11.5–15.5)
WBC: 9.2 10*3/uL (ref 4.0–10.5)
nRBC: 0 % (ref 0.0–0.2)

## 2019-06-11 LAB — URINALYSIS, ROUTINE W REFLEX MICROSCOPIC
Bilirubin Urine: NEGATIVE
Glucose, UA: NEGATIVE mg/dL
Hgb urine dipstick: NEGATIVE
Ketones, ur: NEGATIVE mg/dL
Leukocytes,Ua: NEGATIVE
Nitrite: NEGATIVE
Protein, ur: NEGATIVE mg/dL
Specific Gravity, Urine: 1.011 (ref 1.005–1.030)
pH: 5 (ref 5.0–8.0)

## 2019-06-11 LAB — LIPASE, BLOOD: Lipase: 28 U/L (ref 11–51)

## 2019-06-11 MED ORDER — HYDROCODONE-ACETAMINOPHEN 5-325 MG PO TABS: 1.0000 | ORAL_TABLET | Freq: Four times a day (QID) | ORAL | 0 refills | Status: DC | PRN

## 2019-06-11 MED ORDER — ONDANSETRON HCL 4 MG PO TABS: 4.0000 mg | ORAL_TABLET | Freq: Three times a day (TID) | ORAL | 0 refills | Status: DC | PRN

## 2019-06-11 NOTE — ED Provider Notes (Signed)
[facility]-EMERGENCY DEPT Provider Note   CSN: [id number] Arrival date & time: 06/10/19  1924   Time seen 11:55 PM  History No chief complaint on file.   Hannah Morton is a 29 y.o. female.  HPI   Patient states the evening of the 27th she started getting right upper quadrant pain but states last night it started radiating into her right flank and up into her right shoulder.  She states the pain comes and goes and lasts about an hour.  She does related to eating food and notes that she ate some hot tamales while hitting her baby's bottle in the middle the night this morning and her pain got much worse.  She was seen in the ED yesterday, March 28 and had normal laboratory testing and a benign exam.  She went to [facility] this morning after eating a hot tamales which made her pain worse.  They told her they thought it was her gallbladder.  She denies nausea, vomiting, or fever.  She states she does have some chills.  She denies abdominal bloating.  She states she had 3 episodes of diarrhea yesterday but none today.  She states she did eat a protein shake today without change in her pain.  She states her mother had gallbladder surgery many years ago.  Patient is about 5 weeks postpartum and is G4, P3 Ab1.  PCP Hannah Sax, MD   Past Medical History:  Diagnosis Date  . Frequent headaches   . Migraines   . Obesity (BMI 35.0-39.9 without comorbidity)     Patient Active Problem List   Diagnosis Date Noted  . Postpartum care following vaginal delivery 4/24 05/08/2019  . Delayed delivery after SROM (spontaneous rupture of membranes) 05/07/2019  . 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 SVD (spontaneous vaginal delivery) 09/04/2013    History reviewed. No pertinent surgical history.   OB History    Gravida  4   Para  3   Term  3   Preterm      AB  1   Living  3     SAB      TAB  1   Ectopic      Multiple  0   Live Births  3             Family History  Problem Relation Age of Onset  . Alcohol abuse Mother   . COPD Mother   . Hypertension Mother   . Diabetes Father   . Alcohol abuse Brother   . Hypertension Brother   . Arthritis Maternal Grandmother   . Asthma Maternal Grandmother   . Birth defects Maternal Grandmother   . Cancer Maternal Grandmother   . COPD Maternal Grandmother   . Diabetes Maternal Grandmother   . Heart disease Maternal Grandmother   . Hyperlipidemia Maternal Grandmother   . Stroke Maternal Grandfather   . Stroke Paternal Grandfather   . Heart attack Paternal Grandfather     Social History   Tobacco Use  . Smoking status: Never Smoker  . Smokeless tobacco: Never Used  . Tobacco comment: Only when drinking  Substance Use Topics  . Alcohol use: Yes    Alcohol/week: 2.0 standard drinks    Types: 2 Standard drinks or equivalent per week  . Drug use: No  lives with spouse  Home Medications Prior to Admission medications   Medication Sig Start Date End Date Taking? Authorizing Provider  calcium carbonate (TUMS - DOSED IN  MG ELEMENTAL CALCIUM) 500 MG chewable tablet Chew 1 tablet by mouth 2 (two) times daily as needed for indigestion or heartburn.   Yes [provider]  carboxymethylcellulose (REFRESH PLUS) 0.5 % SOLN Place 1 drop into both eyes 3 (three) times daily as needed (For dryness).   Yes [provider]  sucralfate (CARAFATE) 1 g tablet Take 1 tablet (1 g total) by mouth 4 (four) times daily as needed. Patient taking differently: Take 1 g by mouth 4 (four) times daily as needed (stomach).  06/09/19  Yes Sabas Sous, MD  HYDROcodone-acetaminophen (NORCO/VICODIN) 5-325 MG tablet Take 1 tablet by mouth every 6 (six) hours as needed for moderate pain or severe pain. 06/11/19   Devoria Albe, MD  ondansetron (ZOFRAN) 4 MG tablet Take 1 tablet (4 mg total) by mouth every 8 (eight) hours as needed for nausea or vomiting. 06/11/19   Devoria Albe, MD    Allergies     Codeine  Review of Systems   Review of Systems  All other systems reviewed and are negative.   Physical Exam Updated Vital Signs BP 113/68 (BP Location: Left Arm)   Pulse 66   Temp 97.9 F (36.6 C) (Oral)   Resp 17   Ht 5' (1.524 m)   Wt 86.2 kg   LMP 08/07/2018   SpO2 100%   BMI 37.11 kg/m   Physical Exam Vitals and nursing note reviewed.  Constitutional:      Appearance: Normal appearance. She is obese.  HENT:     Head: Normocephalic and atraumatic.     Right Ear: External ear normal.     Left Ear: External ear normal.     Nose: Nose normal.     Mouth/Throat:     Mouth: Mucous membranes are moist.     Pharynx: No oropharyngeal exudate or posterior oropharyngeal erythema.  Eyes:     General: No scleral icterus.    Extraocular Movements: Extraocular movements intact.     Conjunctiva/sclera: Conjunctivae normal.     Pupils: Pupils are equal, round, and reactive to light.  Cardiovascular:     Rate and Rhythm: Normal rate and regular rhythm.     Pulses: Normal pulses.  Pulmonary:     Effort: Pulmonary effort is normal.     Breath sounds: Normal breath sounds. No wheezing.  Abdominal:     General: Abdomen is flat. Bowel sounds are normal.     Palpations: Abdomen is soft.     Tenderness: There is abdominal tenderness. There is no guarding or rebound.  Musculoskeletal:        General: Normal range of motion.     Cervical back: Normal range of motion.  Skin:    General: Skin is warm and dry.     Coloration: Skin is not jaundiced.  Neurological:     General: No focal deficit present.     Mental Status: She is alert and oriented to person, place, and time.     Cranial Nerves: No cranial nerve deficit.  Psychiatric:        Mood and Affect: Mood normal.        Behavior: Behavior normal.        Thought Content: Thought content normal.     ED Results / Procedures / Treatments   Labs (all labs ordered are listed, but only abnormal results are displayed) Results  for orders placed or performed during the hospital encounter of 06/10/19  Comprehensive metabolic panel  Result Value Ref Range  Sodium 138 135 - 145 mmol/L   Potassium 3.5 3.5 - 5.1 mmol/L   Chloride 102 98 - 111 mmol/L   CO2 27 22 - 32 mmol/L   Glucose, Bld 85 70 - 99 mg/dL   BUN 13 6 - 20 mg/dL   Creatinine, Ser 0.70 0.44 - 1.00 mg/dL   Calcium 9.2 8.9 - 10.3 mg/dL   Total Protein 8.0 6.5 - 8.1 g/dL   Albumin 3.9 3.5 - 5.0 g/dL   AST 41 15 - 41 U/L   ALT 59 (H) 0 - 44 U/L   Alkaline Phosphatase 80 38 - 126 U/L   Total Bilirubin 0.6 0.3 - 1.2 mg/dL   GFR calc non Af Amer >60 >60 mL/min   GFR calc Af Amer >60 >60 mL/min   Anion gap 9 5 - 15  Lipase, blood  Result Value Ref Range   Lipase 28 11 - 51 U/L  CBC with Differential  Result Value Ref Range   WBC 9.2 4.0 - 10.5 K/uL   RBC 5.08 3.87 - 5.11 MIL/uL   Hemoglobin 14.0 12.0 - 15.0 g/dL   HCT 43.0 36.0 - 46.0 %   MCV 84.6 80.0 - 100.0 fL   MCH 27.6 26.0 - 34.0 pg   MCHC 32.6 30.0 - 36.0 g/dL   RDW 14.2 11.5 - 15.5 %   Platelets 288 150 - 400 K/uL   nRBC 0.0 0.0 - 0.2 %   Neutrophils Relative % 54 %   Neutro Abs 4.9 1.7 - 7.7 K/uL   Lymphocytes Relative 36 %   Lymphs Abs 3.3 0.7 - 4.0 K/uL   Monocytes Relative 7 %   Monocytes Absolute 0.6 0.1 - 1.0 K/uL   Eosinophils Relative 3 %   Eosinophils Absolute 0.3 0.0 - 0.5 K/uL   Basophils Relative 0 %   Basophils Absolute 0.0 0.0 - 0.1 K/uL   Immature Granulocytes 0 %   Abs Immature Granulocytes 0.01 0.00 - 0.07 K/uL  Urinalysis, Routine w reflex microscopic  Result Value Ref Range   Color, Urine YELLOW YELLOW   APPearance CLEAR CLEAR   Specific Gravity, Urine 1.011 1.005 - 1.030   pH 5.0 5.0 - 8.0   Glucose, UA NEGATIVE NEGATIVE mg/dL   Hgb urine dipstick NEGATIVE NEGATIVE   Bilirubin Urine NEGATIVE NEGATIVE   Ketones, ur NEGATIVE NEGATIVE mg/dL   Protein, ur NEGATIVE NEGATIVE mg/dL   Nitrite NEGATIVE NEGATIVE   Leukocytes,Ua NEGATIVE NEGATIVE    Laboratory  interpretation all normal except minor elevation of 1 LFT   EKG None  Radiology US ABDOMEN LIMITED RUQ  Result Date: 06/11/2019 CLINICAL DATA:  Right upper quadrant pain EXAM: ULTRASOUND ABDOMEN LIMITED RIGHT UPPER QUADRANT COMPARISON:  None. FINDINGS: Gallbladder: Decompressed although filled with gallstones. No wall thickening or pericholecystic fluid is noted. Common bile duct: Diameter: 3.3 mm. Liver: No focal lesion identified. Within normal limits in parenchymal echogenicity. Portal vein is patent on color Doppler imaging with normal direction of blood flow towards the liver. Other: None. IMPRESSION: Cholelithiasis without complicating factors. No other focal abnormality is seen. Electronically Signed   By: Inez Catalina M.D.   On: 06/11/2019 01:25     CT Angio Chest PE W/Cm &/Or Wo Cm  Result Date: 05/27/2019 CLINICAL DATA:  Shortness of breath and chest pain. Clinical suspicion for pulmonary embolism.IMPRESSION: 1. No evidence of pulmonary embolism. 2. Mild patchy ground-glass opacities in peripheral lung zones, suspicious for atypical infectious or inflammatory process. Electronically Signed   By:  Danae Orleans M.D.   On: 05/27/2019 09:43   DG Chest Port 1 View  Result Date: 05/27/2019 CLINICAL DATA:  Cough, night sweats . IMPRESSION: No active disease. Electronically Signed   By: Delbert Phenix M.D.   On: 05/27/2019 08:40   Procedures Procedures (including critical care time)  Medications Ordered in ED Medications - No data to display  ED Course  I have reviewed the triage vital signs and the nursing notes.  Pertinent labs & imaging results that were available during my care of the patient were reviewed by me and considered in my medical decision making (see chart for details).    MDM Rules/Calculators/A&P                      Patient is in no distress.  She does have tenderness in the right upper quadrant.  Laboratory testing was ordered an ultrasound of the right upper  quadrant was ordered.  Patient drove herself to the ED.  Recheck at 2:10 AM patient was given the results of her blood work and her ultrasound.  We discussed that she needed to follow a low-fat diet.  She was given pain medication to take at home if needed.  Patient is not breast-feeding.  We discussed referral to surgery to discuss removing her gallbladder at a convenient time.  She should return to the ED if she gets fever, has uncontrolled vomiting or pain.   Final Clinical Impression(s) / ED Diagnoses Final diagnoses:  Gallstones  Calculus of gallbladder without cholecystitis without obstruction    Rx / DC Orders ED Discharge Orders         Ordered    HYDROcodone-acetaminophen (NORCO/VICODIN) 5-325 MG tablet  Every 6 hours PRN     06/11/19 0215    ondansetron (ZOFRAN) 4 MG tablet  Every 8 hours PRN     06/11/19 0215         Plan discharge  Devoria Albe, MD, Concha Pyo, MD 06/11/19 [phone]

## 2019-06-11 NOTE — Discharge Instructions (Addendum)
Avoid foods high in fat, fried foods, greasy foods, or spicy foods.  Also avoid any other foods that you find trigger your episodes.  Take the pain medication and nausea medication if needed.  Please call Dr. Ardine Eng office, the surgeon on call, to get a appointment to discuss having your gallbladder removed.  Return to the emergency department if you get fever, uncontrolled vomiting or severe uncontrolled pain.

## 2019-06-11 NOTE — ED Notes (Signed)
Lab contacted to add on additional labs. 

## 2019-06-11 NOTE — ED Notes (Signed)
Patient ambulated to restroom. Attempting to obtain urine sample at this time. Will continue to monitor.

## 2019-07-03 ENCOUNTER — Ambulatory Visit: Payer: Self-pay | Admitting: Surgery

## 2019-07-03 NOTE — H&P (Signed)
Surgical Evaluation  Chief Complaint: abdominal pain  HPI: This is a very pleasant and otherwise healthy 29 year old woman who is referred by the emergency room for biliary colic. She presented there twice at the end of March with right upper quadrant abdominal pain; initially with treated for gastroesophageal reflux disease discharged with Carafate which did not help her. She presented a few days later with recurrent pain in the right upper quadrant radiating to her right flank and right shoulder, colicky and lasting about an hour. This did follow a fatty meal.He did fever, nausea or vomiting, did have some chills and abdominal bloating. She is now about 2 months postpartum. Ultrasound demonstrated multiple gallstones without signs of cholecystitis. Complete metabolic panel and complete blood count were unremarkable. She was discharged with pain medication and follow-up here, she has had another mild attack last night after eating some chips, but otherwise has been doing okay since her Emergency Room visit. No prior abdominal surgery.  She is a Production designer, theatre/television/film at [employer], does occasionally have to do some lifting/pending/strenuous work.  Allergies  Allergen Reactions  . Codeine Rash    Past Medical History:  Diagnosis Date  . Frequent headaches   . Migraines   . Obesity (BMI 35.0-39.9 without comorbidity)     No past surgical history on file.  Family History  Problem Relation Age of Onset  . Alcohol abuse Mother   . COPD Mother   . Hypertension Mother   . Diabetes Father   . Alcohol abuse Brother   . Hypertension Brother   . Arthritis Maternal Grandmother   . Asthma Maternal Grandmother   . Birth defects Maternal Grandmother   . Cancer Maternal Grandmother   . COPD Maternal Grandmother   . Diabetes Maternal Grandmother   . Heart disease Maternal Grandmother   . Hyperlipidemia Maternal Grandmother   . Stroke Maternal Grandfather   . Stroke Paternal Grandfather   . Heart attack  Paternal Grandfather     Social History   Socioeconomic History  . Marital status: Married    Spouse name: Not on file  . Number of children: Not on file  . Years of education: Not on file  . Highest education level: Not on file  Occupational History  . Occupation: Center coordinator/CNA    Employer: [employer]  Tobacco Use  . Smoking status: Never Smoker  . Smokeless tobacco: Never Used  . Tobacco comment: Only when drinking  Substance and Sexual Activity  . Alcohol use: Yes    Alcohol/week: 2.0 standard drinks    Types: 2 Standard drinks or equivalent per week  . Drug use: No  . Sexual activity: Yes    Birth control/protection: None  Other Topics Concern  . Not on file  Social History Narrative   Patient is a married mother of 2 sons.   She works as a Lawyer.     She may have 1-2 alcoholic drinks a week, but never smoked.   She does try to go walking with her sons on occasion, but does not do routine exercise   Social Determinants of Health   Financial Resource Strain:   . Difficulty of Paying Living Expenses:   Food Insecurity:   . Worried About Programme researcher, broadcasting/film/video in the Last Year:   . Barista in the Last Year:   Transportation Needs:   . Freight forwarder (Medical):   Marland Kitchen Lack of Transportation (Non-Medical):   Physical Activity:   . Days  of Exercise per Week:   . Minutes of Exercise per Session:   Stress:   . Feeling of Stress :   Social Connections:   . Frequency of Communication with Friends and Family:   . Frequency of Social Gatherings with Friends and Family:   . Attends Religious Services:   . Active Member of Clubs or Organizations:   . Attends Archivist Meetings:   Marland Kitchen Marital Status:     Current Outpatient Medications on File Prior to Visit  Medication Sig Dispense Refill  . calcium carbonate (TUMS - DOSED IN MG ELEMENTAL CALCIUM) 500 MG chewable tablet Chew 1 tablet by mouth 2 (two) times daily as needed for  indigestion or heartburn.    . carboxymethylcellulose (REFRESH PLUS) 0.5 % SOLN Place 1 drop into both eyes 3 (three) times daily as needed (For dryness).    Marland Kitchen HYDROcodone-acetaminophen (NORCO/VICODIN) 5-325 MG tablet Take 1 tablet by mouth every 6 (six) hours as needed for moderate pain or severe pain. 10 tablet 0  . ondansetron (ZOFRAN) 4 MG tablet Take 1 tablet (4 mg total) by mouth every 8 (eight) hours as needed for nausea or vomiting. 10 tablet 0  . sucralfate (CARAFATE) 1 g tablet Take 1 tablet (1 g total) by mouth 4 (four) times daily as needed. (Patient taking differently: Take 1 g by mouth 4 (four) times daily as needed (stomach). ) 30 tablet 0   No current facility-administered medications on file prior to visit.    Review of Systems: a complete, 10pt review of systems was completed with pertinent positives and negatives as documented in the HPI  Physical Exam: Vitals  Weight: 195 lb Height: 60in Body Surface Area: 1.85 m Body Mass Index: 38.08 kg/m  Temp.: 98.74F  Pulse: 80 (Regular)  P.OX: 98% (Room air) BP: 124/80 (Sitting, Left Arm, Standard)  Alert and well-appearing Unlabored respirations Abdomen is soft, mildly tender in the right subcostal and epigastric regions   CBC Latest Ref Rng & Units 06/11/2019 06/09/2019 05/27/2019  WBC 4.0 - 10.5 K/uL 9.2 8.3 13.1(H)  Hemoglobin 12.0 - 15.0 g/dL 14.0 13.5 13.0  Hematocrit 36.0 - 46.0 % 43.0 42.0 40.9  Platelets 150 - 400 K/uL 288 92(L) 288    CMP Latest Ref Rng & Units 06/11/2019 06/09/2019 05/27/2019  Glucose 70 - 99 mg/dL 85 76 123(H)  BUN 6 - 20 mg/dL 13 14 8   Creatinine 0.44 - 1.00 mg/dL 0.70 0.70 0.70  Sodium 135 - 145 mmol/L 138 139 135  Potassium 3.5 - 5.1 mmol/L 3.5 4.1 3.3(L)  Chloride 98 - 111 mmol/L 102 106 101  CO2 22 - 32 mmol/L 27 23 24   Calcium 8.9 - 10.3 mg/dL 9.2 9.0 8.2(L)  Total Protein 6.5 - 8.1 g/dL 8.0 7.5 -  Total Bilirubin 0.3 - 1.2 mg/dL 0.6 0.4 -  Alkaline Phos 38 - 126 U/L 80 71 -   AST 15 - 41 U/L 41 16 -  ALT 0 - 44 U/L 59(H) 15 -    No results found for: INR, PROTIME  Imaging: No results found.   A/P: CHOLECYSTITIS (K81.9) Story: Versus biliary colic. I recommend proceeding with laparoscopic versus robotic cholecystectomy with possible cholangiogram. Discussed risks of surgery including bleeding, pain, scarring, intraabdominal injury specifically to the common bile duct and sequelae, bile leak, conversion to open surgery, failure to resolve symptoms, blood clots/ pulmonary embolus, heart attack, pneumonia, stroke, death. Questions welcomed and answered to patient's satisfaction. She would like to proceed.  Patient  Active Problem List   Diagnosis Date Noted  . Postpartum care following vaginal delivery 4/24 05/08/2019  . Delayed delivery after SROM (spontaneous rupture of membranes) 05/07/2019  . 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 SVD (spontaneous vaginal delivery) 09/04/2013       Phylliss Blakes, MD [facility]  See AMION to contact appropriate on-call provider

## 2019-07-03 NOTE — H&P (View-Only) (Signed)
Surgical Evaluation  Chief Complaint: abdominal pain  HPI: This is a very pleasant and otherwise healthy 29-year-old woman who is referred by the emergency room for biliary colic. She presented there twice at the end of March with right upper quadrant abdominal pain; initially with treated for gastroesophageal reflux disease discharged with Carafate which did not help her. She presented a few days later with recurrent pain in the right upper quadrant radiating to her right flank and right shoulder, colicky and lasting about an hour. This did follow a fatty meal.He did fever, nausea or vomiting, did have some chills and abdominal bloating. She is now about 2 months postpartum. Ultrasound demonstrated multiple gallstones without signs of cholecystitis. Complete metabolic panel and complete blood count were unremarkable. She was discharged with pain medication and follow-up here, she has had another mild attack last night after eating some chips, but otherwise has been doing okay since her Emergency Room visit. No prior abdominal surgery.  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  Allergies  Allergen Reactions  . Codeine Rash    Past Medical History:  Diagnosis Date  . Frequent headaches   . Migraines   . Obesity (BMI 35.0-39.9 without comorbidity)     No past surgical history on file.  Family History  Problem Relation Age of Onset  . Alcohol abuse Mother   . COPD Mother   . Hypertension Mother   . Diabetes Father   . Alcohol abuse Brother   . Hypertension Brother   . Arthritis Maternal Grandmother   . Asthma Maternal Grandmother   . Birth defects Maternal Grandmother   . Cancer Maternal Grandmother   . COPD Maternal Grandmother   . Diabetes Maternal Grandmother   . Heart disease Maternal Grandmother   . Hyperlipidemia Maternal Grandmother   . Stroke Maternal Grandfather   . Stroke Paternal Grandfather   . Heart attack  Paternal Grandfather     Social History   Socioeconomic History  . Marital status: Married    Spouse name: Not on file  . Number of children: Not on file  . Years of education: Not on file  . Highest education level: Not on file  Occupational History  . Occupation: Center coordinator/CNA    Employer: [employer]  Tobacco Use  . Smoking status: Never Smoker  . Smokeless tobacco: Never Used  . Tobacco comment: Only when drinking  Substance and Sexual Activity  . Alcohol use: Yes    Alcohol/week: 2.0 standard drinks    Types: 2 Standard drinks or equivalent per week  . Drug use: No  . Sexual activity: Yes    Birth control/protection: None  Other Topics Concern  . Not on file  Social History Narrative   Patient is a married mother of 2 sons.   She works as a CNA.     She may have 1-2 alcoholic drinks a week, but never smoked.   She does try to go walking with her sons on occasion, but does not do routine exercise   Social Determinants of Health   Financial Resource Strain:   . Difficulty of Paying Living Expenses:   Food Insecurity:   . Worried About Running Out of Food in the Last Year:   . Ran Out of Food in the Last Year:   Transportation Needs:   . Lack of Transportation (Medical):   . Lack of Transportation (Non-Medical):   Physical Activity:   . Days   of Exercise per Week:   . Minutes of Exercise per Session:   Stress:   . Feeling of Stress :   Social Connections:   . Frequency of Communication with Friends and Family:   . Frequency of Social Gatherings with Friends and Family:   . Attends Religious Services:   . Active Member of Clubs or Organizations:   . Attends Archivist Meetings:   Marland Kitchen Marital Status:     Current Outpatient Medications on File Prior to Visit  Medication Sig Dispense Refill  . calcium carbonate (TUMS - DOSED IN MG ELEMENTAL CALCIUM) 500 MG chewable tablet Chew 1 tablet by mouth 2 (two) times daily as needed for  indigestion or heartburn.    . carboxymethylcellulose (REFRESH PLUS) 0.5 % SOLN Place 1 drop into both eyes 3 (three) times daily as needed (For dryness).    Marland Kitchen HYDROcodone-acetaminophen (NORCO/VICODIN) 5-325 MG tablet Take 1 tablet by mouth every 6 (six) hours as needed for moderate pain or severe pain. 10 tablet 0  . ondansetron (ZOFRAN) 4 MG tablet Take 1 tablet (4 mg total) by mouth every 8 (eight) hours as needed for nausea or vomiting. 10 tablet 0  . sucralfate (CARAFATE) 1 g tablet Take 1 tablet (1 g total) by mouth 4 (four) times daily as needed. (Patient taking differently: Take 1 g by mouth 4 (four) times daily as needed (stomach). ) 30 tablet 0   No current facility-administered medications on file prior to visit.    Review of Systems: a complete, 10pt review of systems was completed with pertinent positives and negatives as documented in the HPI  Physical Exam: Vitals  Weight: 195 lb Height: 60in Body Surface Area: 1.85 m Body Mass Index: 38.08 kg/m  Temp.: 98.74F  Pulse: 80 (Regular)  P.OX: 98% (Room air) BP: 124/80 (Sitting, Left Arm, Standard)  Alert and well-appearing Unlabored respirations Abdomen is soft, mildly tender in the right subcostal and epigastric regions   CBC Latest Ref Rng & Units 06/11/2019 06/09/2019 05/27/2019  WBC 4.0 - 10.5 K/uL 9.2 8.3 13.1(H)  Hemoglobin 12.0 - 15.0 g/dL 14.0 13.5 13.0  Hematocrit 36.0 - 46.0 % 43.0 42.0 40.9  Platelets 150 - 400 K/uL 288 92(L) 288    CMP Latest Ref Rng & Units 06/11/2019 06/09/2019 05/27/2019  Glucose 70 - 99 mg/dL 85 76 123(H)  BUN 6 - 20 mg/dL 13 14 8   Creatinine 0.44 - 1.00 mg/dL 0.70 0.70 0.70  Sodium 135 - 145 mmol/L 138 139 135  Potassium 3.5 - 5.1 mmol/L 3.5 4.1 3.3(L)  Chloride 98 - 111 mmol/L 102 106 101  CO2 22 - 32 mmol/L 27 23 24   Calcium 8.9 - 10.3 mg/dL 9.2 9.0 8.2(L)  Total Protein 6.5 - 8.1 g/dL 8.0 7.5 -  Total Bilirubin 0.3 - 1.2 mg/dL 0.6 0.4 -  Alkaline Phos 38 - 126 U/L 80 71 -   AST 15 - 41 U/L 41 16 -  ALT 0 - 44 U/L 59(H) 15 -    No results found for: INR, PROTIME  Imaging: No results found.   A/P: CHOLECYSTITIS (K81.9) Story: Versus biliary colic. I recommend proceeding with laparoscopic versus robotic cholecystectomy with possible cholangiogram. Discussed risks of surgery including bleeding, pain, scarring, intraabdominal injury specifically to the common bile duct and sequelae, bile leak, conversion to open surgery, failure to resolve symptoms, blood clots/ pulmonary embolus, heart attack, pneumonia, stroke, death. Questions welcomed and answered to patient's satisfaction. She would like to proceed.  Patient  Active Problem List   Diagnosis Date Noted  . Postpartum care following vaginal delivery 4/24 05/08/2019  . Delayed delivery after SROM (spontaneous rupture of membranes) 05/07/2019  . 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 SVD (spontaneous vaginal delivery) 09/04/2013       Phylliss Blakes, MD [facility]  See AMION to contact appropriate on-call provider

## 2019-07-09 NOTE — Patient Instructions (Addendum)
DUE TO COVID-19 ONLY ONE VISITOR IS ALLOWED TO COME WITH YOU AND STAY IN THE WAITING ROOM ONLY DURING PRE OP AND PROCEDURE DAY OF SURGERY. THE 1 VISITOR MAY VISIT WITH YOU AFTER SURGERY IN YOUR PRIVATE ROOM DURING VISITING HOURS ONLY!  YOU NEED TO HAVE A COVID 19 TEST ON 07-12-19  @ 8:20 AM, THIS TEST MUST BE DONE BEFORE SURGERY, COME  [address], [place] , [address].  [facility]) ONCE YOUR COVID TEST IS COMPLETED, PLEASE BEGIN THE QUARANTINE INSTRUCTIONS AS OUTLINED IN YOUR HANDOUT.                Hannah Morton  07/09/2019   Your procedure is scheduled on: 07-16-19   Report to [facility] Main  Entrance    Report to Short Stay at 5:30 AM     Call this number if you have problems the morning of surgery [phone]    Remember: Do not eat food or drink liquids :After Midnight.    Take these medicines the morning of surgery with A SIP OF WATER: None. You may use your eyedrops    BRUSH YOUR TEETH MORNING OF SURGERY AND RINSE YOUR MOUTH OUT, NO CHEWING GUM CANDY OR MINTS.                               You may not have any metal on your body including hair pins and              piercings    Do not wear jewelry, make-up, lotions, powders or perfumes, deodorant              Do not wear nail polish on your fingernails.  Do not shave  48 hours prior to surgery.      Do not bring valuables to the hospital. [facility] IS NOT             RESPONSIBLE   FOR VALUABLES.  Contacts, dentures or bridgework may not be worn into surgery.      Patients discharged the day of surgery will not be allowed to drive home. IF YOU ARE HAVING SURGERY AND GOING HOME THE SAME DAY, YOU MUST HAVE AN ADULT TO DRIVE YOU HOME AND BE WITH YOU FOR 24 HOURS. YOU MAY GO HOME BY TAXI OR UBER OR ORTHERWISE, BUT AN ADULT MUST ACCOMPANY YOU HOME AND STAY WITH YOU FOR 24 HOURS.  Name and phone number of your driver: Sheral Apley [phone]  Special Instructions: N/A   Please read over the following fact sheets you were given: _____________________________________________________________________             [facility] - Preparing for Surgery Before surgery, you can play an important role.  Because skin is not sterile, your skin needs to be as free of germs as possible.  You can reduce the number of germs on your skin by washing with CHG (chlorahexidine gluconate) soap before surgery.  CHG is an antiseptic cleaner which kills germs and bonds with the skin to continue killing germs even after washing. Please DO NOT use if you have an allergy to CHG or antibacterial soaps.  If your skin becomes reddened/irritated stop using the CHG and inform your nurse when you arrive at Short Stay. Do not shave (including legs and underarms) for at least 48 hours prior to the first CHG shower.  You may shave your face/neck. Please follow  these instructions carefully:  1.  Shower with CHG Soap the night before surgery and the  morning of Surgery.  2.  If you choose to wash your hair, wash your hair first as usual with your  normal  shampoo.  3.  After you shampoo, rinse your hair and body thoroughly to remove the  shampoo.                           4.  Use CHG as you would any other liquid soap.  You can apply chg directly  to the skin and wash                       Gently with a scrungie or clean washcloth.  5.  Apply the CHG Soap to your body ONLY FROM THE NECK DOWN.   Do not use on face/ open                           Wound or open sores. Avoid contact with eyes, ears mouth and genitals (private parts).                       Wash face,  Genitals (private parts) with your normal soap.             6.  Wash thoroughly, paying special attention to the area where your surgery  will be performed.  7.  Thoroughly rinse your body with warm water from the neck down.  8.  DO NOT shower/wash with your normal soap after using and rinsing off  the CHG Soap.                9.  Pat  yourself dry with a clean towel.            10.  Wear clean pajamas.            11.  Place clean sheets on your bed the night of your first shower and do not  sleep with pets. Day of Surgery : Do not apply any lotions/deodorants the morning of surgery.  Please wear clean clothes to the hospital/surgery center.  FAILURE TO FOLLOW THESE INSTRUCTIONS MAY RESULT IN THE CANCELLATION OF YOUR SURGERY PATIENT SIGNATURE_________________________________  NURSE SIGNATURE__________________________________  ________________________________________________________________________

## 2019-07-09 NOTE — Progress Notes (Signed)
PCP - Mliss Sax, MD Cardiologist -   Chest x-ray - 05-27-19 EKG - 05-28-19 Stress Test - 08-10-17  ECHO -  Cardiac Cath -   Sleep Study -  CPAP -   Fasting Blood Sugar -  Checks Blood Sugar _____ times a day  Blood Thinner Instructions: Aspirin Instructions: Last Dose:  Anesthesia review:   Patient denies shortness of breath, fever, cough and chest pain at PAT appointment   Patient verbalized understanding of instructions that were given to them at the PAT appointment. Patient was also instructed that they will need to review over the PAT instructions again at home before surgery.

## 2019-07-11 ENCOUNTER — Encounter (HOSPITAL_COMMUNITY)
Admission: RE | Admit: 2019-07-11 | Discharge: 2019-07-11 | Disposition: A | Discharge status: Home / Self Care | Payer: Medicaid Other | Source: Ambulatory Visit | Attending: Surgery | Admitting: Surgery

## 2019-07-11 ENCOUNTER — Other Ambulatory Visit: Payer: Self-pay

## 2019-07-11 ENCOUNTER — Encounter (HOSPITAL_COMMUNITY): Payer: Self-pay

## 2019-07-11 DIAGNOSIS — Z01812 Encounter for preprocedural laboratory examination: Secondary | ICD-10-CM | POA: Insufficient documentation

## 2019-07-11 DIAGNOSIS — Z20822 Contact with and (suspected) exposure to covid-19: Secondary | ICD-10-CM | POA: Insufficient documentation

## 2019-07-12 ENCOUNTER — Other Ambulatory Visit (HOSPITAL_COMMUNITY)
Admission: RE | Admit: 2019-07-12 | Discharge: 2019-07-12 | Disposition: A | Discharge status: Home / Self Care | Payer: Medicaid Other | Source: Ambulatory Visit | Attending: Surgery | Admitting: Surgery

## 2019-07-12 ENCOUNTER — Encounter (HOSPITAL_COMMUNITY)
Admission: RE | Admit: 2019-07-12 | Discharge: 2019-07-12 | Disposition: A | Discharge status: Home / Self Care | Payer: Medicaid Other | Source: Ambulatory Visit | Attending: Surgery | Admitting: Surgery

## 2019-07-12 DIAGNOSIS — Z01812 Encounter for preprocedural laboratory examination: Secondary | ICD-10-CM | POA: Diagnosis not present

## 2019-07-12 DIAGNOSIS — Z20822 Contact with and (suspected) exposure to covid-19: Secondary | ICD-10-CM | POA: Diagnosis not present

## 2019-07-12 LAB — CBC
HCT: 38.4 % (ref 36.0–46.0)
Hemoglobin: 12.7 g/dL (ref 12.0–15.0)
MCH: 28.4 pg (ref 26.0–34.0)
MCHC: 33.1 g/dL (ref 30.0–36.0)
MCV: 85.9 fL (ref 80.0–100.0)
Platelets: 276 10*3/uL (ref 150–400)
RBC: 4.47 MIL/uL (ref 3.87–5.11)
RDW: 16.1 % — ABNORMAL HIGH (ref 11.5–15.5)
WBC: 8.6 10*3/uL (ref 4.0–10.5)
nRBC: 0 % (ref 0.0–0.2)

## 2019-07-12 LAB — SARS CORONAVIRUS 2 (TAT 6-24 HRS): SARS Coronavirus 2: NEGATIVE

## 2019-07-15 NOTE — Anesthesia Preprocedure Evaluation (Addendum)
Anesthesia Evaluation  Patient identified by MRN, date of birth, ID band Patient awake    Reviewed: Allergy & Precautions, NPO status , Patient's Chart, lab work & pertinent test results  History of Anesthesia Complications Negative for: history of anesthetic complications  Airway Mallampati: I  TM Distance: >3 FB Neck ROM: Full    Dental  (+) Teeth Intact   Pulmonary neg pulmonary ROS,    Pulmonary exam normal        Cardiovascular negative cardio ROS Normal cardiovascular exam     Neuro/Psych negative neurological ROS  negative psych ROS   GI/Hepatic Neg liver ROS, cholecystitis   Endo/Other  negative endocrine ROS  Renal/GU negative Renal ROS  negative genitourinary   Musculoskeletal negative musculoskeletal ROS (+)   Abdominal   Peds  Hematology negative hematology ROS (+)   Anesthesia Other Findings   Reproductive/Obstetrics                            Anesthesia Physical Anesthesia Plan  ASA: II  Anesthesia Plan: General   Post-op Pain Management:    Induction: Intravenous  PONV Risk Score and Plan: 3 and Ondansetron, Dexamethasone, Treatment may vary due to age or medical condition and Midazolam  Airway Management Planned: Oral ETT  Additional Equipment: None  Intra-op Plan:   Post-operative Plan: Extubation in OR  Informed Consent: I have reviewed the patients History and Physical, chart, labs and discussed the procedure including the risks, benefits and alternatives for the proposed anesthesia with the patient or authorized representative who has indicated his/her understanding and acceptance.     Dental advisory given  Plan Discussed with:   Anesthesia Plan Comments:        Anesthesia Quick Evaluation

## 2019-07-16 ENCOUNTER — Other Ambulatory Visit: Payer: Self-pay

## 2019-07-16 ENCOUNTER — Encounter (HOSPITAL_COMMUNITY): Payer: Self-pay | Admitting: Surgery

## 2019-07-16 ENCOUNTER — Ambulatory Visit (HOSPITAL_COMMUNITY)
Admission: RE | Admit: 2019-07-16 | Discharge: 2019-07-16 | Disposition: A | Discharge status: Home / Self Care | Payer: Medicaid Other | Attending: Surgery | Admitting: Surgery

## 2019-07-16 ENCOUNTER — Ambulatory Visit (HOSPITAL_COMMUNITY): Payer: Medicaid Other | Admitting: Anesthesiology

## 2019-07-16 ENCOUNTER — Encounter (HOSPITAL_COMMUNITY)
Admission: RE | Disposition: A | Discharge status: Home / Self Care | Payer: Self-pay | Source: Home / Self Care | Attending: Surgery

## 2019-07-16 DIAGNOSIS — Z885 Allergy status to narcotic agent status: Secondary | ICD-10-CM | POA: Diagnosis not present

## 2019-07-16 DIAGNOSIS — Z823 Family history of stroke: Secondary | ICD-10-CM | POA: Insufficient documentation

## 2019-07-16 DIAGNOSIS — Z811 Family history of alcohol abuse and dependence: Secondary | ICD-10-CM | POA: Insufficient documentation

## 2019-07-16 DIAGNOSIS — Z8249 Family history of ischemic heart disease and other diseases of the circulatory system: Secondary | ICD-10-CM | POA: Insufficient documentation

## 2019-07-16 DIAGNOSIS — Z809 Family history of malignant neoplasm, unspecified: Secondary | ICD-10-CM | POA: Insufficient documentation

## 2019-07-16 DIAGNOSIS — Z8349 Family history of other endocrine, nutritional and metabolic diseases: Secondary | ICD-10-CM | POA: Diagnosis not present

## 2019-07-16 DIAGNOSIS — K219 Gastro-esophageal reflux disease without esophagitis: Secondary | ICD-10-CM | POA: Diagnosis not present

## 2019-07-16 DIAGNOSIS — Z79899 Other long term (current) drug therapy: Secondary | ICD-10-CM | POA: Diagnosis not present

## 2019-07-16 DIAGNOSIS — E669 Obesity, unspecified: Secondary | ICD-10-CM | POA: Diagnosis not present

## 2019-07-16 DIAGNOSIS — Z833 Family history of diabetes mellitus: Secondary | ICD-10-CM | POA: Diagnosis not present

## 2019-07-16 DIAGNOSIS — Z8261 Family history of arthritis: Secondary | ICD-10-CM | POA: Diagnosis not present

## 2019-07-16 DIAGNOSIS — K8010 Calculus of gallbladder with chronic cholecystitis without obstruction: Secondary | ICD-10-CM | POA: Diagnosis not present

## 2019-07-16 DIAGNOSIS — G43909 Migraine, unspecified, not intractable, without status migrainosus: Secondary | ICD-10-CM | POA: Diagnosis not present

## 2019-07-16 DIAGNOSIS — Z6837 Body mass index (BMI) 37.0-37.9, adult: Secondary | ICD-10-CM | POA: Diagnosis not present

## 2019-07-16 DIAGNOSIS — Z8489 Family history of other specified conditions: Secondary | ICD-10-CM | POA: Diagnosis not present

## 2019-07-16 DIAGNOSIS — Z825 Family history of asthma and other chronic lower respiratory diseases: Secondary | ICD-10-CM | POA: Diagnosis not present

## 2019-07-16 LAB — PREGNANCY, URINE: Preg Test, Ur: NEGATIVE

## 2019-07-16 SURGERY — CHOLECYSTECTOMY, ROBOT-ASSISTED, LAPAROSCOPIC
Anesthesia: General

## 2019-07-16 MED ORDER — LACTATED RINGERS IV SOLN
INTRAVENOUS | Status: DC | PRN
  Administered 2019-07-16: 1000 mL

## 2019-07-16 MED ORDER — 0.9 % SODIUM CHLORIDE (POUR BTL) OPTIME
TOPICAL | Status: DC | PRN
  Administered 2019-07-16: 08:00:00 1000 mL

## 2019-07-16 MED ORDER — MIDAZOLAM HCL 2 MG/2ML IJ SOLN
INTRAMUSCULAR | Status: DC | PRN
  Administered 2019-07-16: 2 mg via INTRAVENOUS

## 2019-07-16 MED ORDER — ACETAMINOPHEN 325 MG PO TABS: 650.0000 mg | ORAL_TABLET | ORAL | Status: DC | PRN

## 2019-07-16 MED ORDER — FENTANYL CITRATE (PF) 100 MCG/2ML IJ SOLN
25.0000 ug | INTRAMUSCULAR | Status: DC | PRN
  Administered 2019-07-16: 50 ug via INTRAVENOUS

## 2019-07-16 MED ORDER — DOCUSATE SODIUM 100 MG PO CAPS
100.0000 mg | ORAL_CAPSULE | Freq: Two times a day (BID) | ORAL | 0 refills | Status: AC
Start: 2019-07-16 — End: 2019-08-15

## 2019-07-16 MED ORDER — ACETAMINOPHEN 500 MG PO TABS
1000.0000 mg | ORAL_TABLET | ORAL | Status: AC
  Administered 2019-07-16: 1000 mg via ORAL
  Filled 2019-07-16: qty 2

## 2019-07-16 MED ORDER — BUPIVACAINE LIPOSOME 1.3 % IJ SUSP
20.0000 mL | Freq: Once | INTRAMUSCULAR | Status: DC
  Filled 2019-07-16: qty 20

## 2019-07-16 MED ORDER — ROCURONIUM BROMIDE 10 MG/ML (PF) SYRINGE
PREFILLED_SYRINGE | INTRAVENOUS | Status: DC | PRN
  Administered 2019-07-16: 60 mg via INTRAVENOUS

## 2019-07-16 MED ORDER — FENTANYL CITRATE (PF) 100 MCG/2ML IJ SOLN
INTRAMUSCULAR | Status: AC
  Filled 2019-07-16: qty 2

## 2019-07-16 MED ORDER — OXYCODONE HCL 5 MG PO TABS
5.0000 mg | ORAL_TABLET | ORAL | Status: DC | PRN
  Administered 2019-07-16: 10:00:00 5 mg via ORAL

## 2019-07-16 MED ORDER — LACTATED RINGERS IV SOLN: INTRAVENOUS | Status: DC

## 2019-07-16 MED ORDER — OXYCODONE HCL 5 MG PO TABS
ORAL_TABLET | ORAL | Status: AC
  Filled 2019-07-16: qty 1

## 2019-07-16 MED ORDER — CHLORHEXIDINE GLUCONATE 4 % EX LIQD: 60.0000 mL | Freq: Once | CUTANEOUS | Status: DC

## 2019-07-16 MED ORDER — ONDANSETRON HCL 4 MG/2ML IJ SOLN
INTRAMUSCULAR | Status: DC | PRN
  Administered 2019-07-16: 4 mg via INTRAVENOUS

## 2019-07-16 MED ORDER — FENTANYL CITRATE (PF) 100 MCG/2ML IJ SOLN
INTRAMUSCULAR | Status: DC | PRN
  Administered 2019-07-16 (×2): 50 ug via INTRAVENOUS
  Administered 2019-07-16: 25 ug via INTRAVENOUS
  Administered 2019-07-16 (×2): 50 ug via INTRAVENOUS

## 2019-07-16 MED ORDER — BUPIVACAINE HCL (PF) 0.25 % IJ SOLN
INTRAMUSCULAR | Status: DC | PRN
  Administered 2019-07-16: 30 mL

## 2019-07-16 MED ORDER — DEXAMETHASONE SODIUM PHOSPHATE 10 MG/ML IJ SOLN
INTRAMUSCULAR | Status: DC | PRN
  Administered 2019-07-16: 8 mg via INTRAVENOUS

## 2019-07-16 MED ORDER — LIDOCAINE 2% (20 MG/ML) 5 ML SYRINGE
INTRAMUSCULAR | Status: AC
  Filled 2019-07-16: qty 5

## 2019-07-16 MED ORDER — ROCURONIUM BROMIDE 10 MG/ML (PF) SYRINGE
PREFILLED_SYRINGE | INTRAVENOUS | Status: AC
  Filled 2019-07-16: qty 10

## 2019-07-16 MED ORDER — SODIUM CHLORIDE 0.9% FLUSH: 3.0000 mL | INTRAVENOUS | Status: DC | PRN

## 2019-07-16 MED ORDER — PROPOFOL 10 MG/ML IV BOLUS
INTRAVENOUS | Status: AC
  Filled 2019-07-16: qty 20

## 2019-07-16 MED ORDER — DEXAMETHASONE SODIUM PHOSPHATE 10 MG/ML IJ SOLN
INTRAMUSCULAR | Status: AC
  Filled 2019-07-16: qty 1

## 2019-07-16 MED ORDER — MIDAZOLAM HCL 2 MG/2ML IJ SOLN
INTRAMUSCULAR | Status: AC
  Filled 2019-07-16: qty 2

## 2019-07-16 MED ORDER — BUPIVACAINE HCL 0.25 % IJ SOLN
INTRAMUSCULAR | Status: AC
  Filled 2019-07-16: qty 1

## 2019-07-16 MED ORDER — SODIUM CHLORIDE 0.9% FLUSH: 3.0000 mL | Freq: Two times a day (BID) | INTRAVENOUS | Status: DC

## 2019-07-16 MED ORDER — BUPIVACAINE LIPOSOME 1.3 % IJ SUSP
INTRAMUSCULAR | Status: DC | PRN
  Administered 2019-07-16: 20 mL

## 2019-07-16 MED ORDER — LIDOCAINE 2% (20 MG/ML) 5 ML SYRINGE
INTRAMUSCULAR | Status: DC | PRN
  Administered 2019-07-16: 50 mg via INTRAVENOUS

## 2019-07-16 MED ORDER — ACETAMINOPHEN 650 MG RE SUPP
650.0000 mg | RECTAL | Status: DC | PRN
  Filled 2019-07-16: qty 1

## 2019-07-16 MED ORDER — ONDANSETRON HCL 4 MG/2ML IJ SOLN
INTRAMUSCULAR | Status: AC
  Filled 2019-07-16: qty 2

## 2019-07-16 MED ORDER — TRAMADOL HCL 50 MG PO TABS: 50.0000 mg | ORAL_TABLET | Freq: Four times a day (QID) | ORAL | 0 refills | Status: DC | PRN

## 2019-07-16 MED ORDER — GABAPENTIN 300 MG PO CAPS
300.0000 mg | ORAL_CAPSULE | ORAL | Status: AC
  Administered 2019-07-16: 300 mg via ORAL
  Filled 2019-07-16: qty 1

## 2019-07-16 MED ORDER — INDOCYANINE GREEN 25 MG IV SOLR
2.5000 mg | Freq: Once | INTRAVENOUS | Status: AC
  Administered 2019-07-16: 07:00:00 2.5 mg via INTRAVENOUS
  Filled 2019-07-16: qty 1

## 2019-07-16 MED ORDER — SUGAMMADEX SODIUM 200 MG/2ML IV SOLN
INTRAVENOUS | Status: DC | PRN
  Administered 2019-07-16: 200 mg via INTRAVENOUS

## 2019-07-16 MED ORDER — CEFAZOLIN SODIUM-DEXTROSE 2-4 GM/100ML-% IV SOLN
2.0000 g | INTRAVENOUS | Status: AC
  Administered 2019-07-16: 2 g via INTRAVENOUS
  Filled 2019-07-16: qty 100

## 2019-07-16 MED ORDER — PROPOFOL 10 MG/ML IV BOLUS
INTRAVENOUS | Status: DC | PRN
  Administered 2019-07-16: 200 mg via INTRAVENOUS

## 2019-07-16 MED ORDER — SODIUM CHLORIDE 0.9 % IV SOLN: 250.0000 mL | INTRAVENOUS | Status: DC | PRN

## 2019-07-16 SURGICAL SUPPLY — 52 items
APPLIER CLIP 5 13 M/L LIGAMAX5 (MISCELLANEOUS)
BLADE SURG SZ11 CARB STEEL (BLADE) ×2 IMPLANT
CHLORAPREP W/TINT 26 (MISCELLANEOUS) ×2 IMPLANT
CLIP APPLIE 5 13 M/L LIGAMAX5 (MISCELLANEOUS) IMPLANT
CLIP VESOLOCK LG 6/CT PURPLE (CLIP) ×2 IMPLANT
COVER TIP SHEARS 8 DVNC (MISCELLANEOUS) ×1 IMPLANT
COVER TIP SHEARS 8MM DA VINCI (MISCELLANEOUS) ×1
COVER WAND RF STERILE (DRAPES) ×2 IMPLANT
DECANTER SPIKE VIAL GLASS SM (MISCELLANEOUS) ×2 IMPLANT
DERMABOND ADVANCED (GAUZE/BANDAGES/DRESSINGS) ×1
DERMABOND ADVANCED .7 DNX12 (GAUZE/BANDAGES/DRESSINGS) ×1 IMPLANT
DRAPE ARM DVNC X/XI (DISPOSABLE) ×4 IMPLANT
DRAPE COLUMN DVNC XI (DISPOSABLE) ×1 IMPLANT
DRAPE DA VINCI XI ARM (DISPOSABLE) ×4
DRAPE DA VINCI XI COLUMN (DISPOSABLE) ×1
ELECT PENCIL ROCKER SW 15FT (MISCELLANEOUS) IMPLANT
ELECT REM PT RETURN 15FT ADLT (MISCELLANEOUS) ×2 IMPLANT
GAUZE SPONGE 2X2 8PLY STRL LF (GAUZE/BANDAGES/DRESSINGS) IMPLANT
GLOVE BIO SURGEON STRL SZ 6 (GLOVE) ×4 IMPLANT
GLOVE INDICATOR 6.5 STRL GRN (GLOVE) ×4 IMPLANT
GOWN STRL REUS W/TWL LRG LVL3 (GOWN DISPOSABLE) ×4 IMPLANT
GOWN STRL REUS W/TWL XL LVL3 (GOWN DISPOSABLE) IMPLANT
GRASPER SUT TROCAR 14GX15 (MISCELLANEOUS) IMPLANT
IRRIGATOR SUCT 8 DISP DVNC XI (IRRIGATION / IRRIGATOR) IMPLANT
IRRIGATOR SUCTION 8MM XI DISP (IRRIGATION / IRRIGATOR)
KIT BASIN (CUSTOM PROCEDURE TRAY) ×2 IMPLANT
KIT PROCEDURE DA VINCI SI (MISCELLANEOUS)
KIT PROCEDURE DVNC SI (MISCELLANEOUS) IMPLANT
KIT TURNOVER KIT A (KITS) IMPLANT
MANIFOLD NEPTUNE II (INSTRUMENTS) ×2 IMPLANT
NEEDLE HYPO 22GX1.5 SAFETY (NEEDLE) ×2 IMPLANT
NEEDLE INSUFFLATION 14GA 120MM (NEEDLE) ×2 IMPLANT
PACK CARDIOVASCULAR III (CUSTOM PROCEDURE TRAY) ×2 IMPLANT
SEAL CANN UNIV 5-8 DVNC XI (MISCELLANEOUS) ×4 IMPLANT
SEAL XI 5MM-8MM UNIVERSAL (MISCELLANEOUS) ×4
SEALER VESSEL DA VINCI XI (MISCELLANEOUS)
SEALER VESSEL EXT DVNC XI (MISCELLANEOUS) IMPLANT
SET IRRIG TUBING LAPAROSCOPIC (IRRIGATION / IRRIGATOR) IMPLANT
SOL ANTI FOG 6CC (MISCELLANEOUS) ×1 IMPLANT
SOLUTION ANTI FOG 6CC (MISCELLANEOUS) ×1
SOLUTION ELECTROLUBE (MISCELLANEOUS) ×2 IMPLANT
SPONGE GAUZE 2X2 STER 10/PKG (GAUZE/BANDAGES/DRESSINGS)
SPONGE LAP 18X18 RF (DISPOSABLE) ×2 IMPLANT
SUT MNCRL AB 4-0 PS2 18 (SUTURE) ×2 IMPLANT
SYR CONTROL 10ML LL (SYRINGE) ×2 IMPLANT
SYS RETRIEVAL 5MM INZII UNIV (BASKET) ×2
SYSTEM RETRIEVL 5MM INZII UNIV (BASKET) ×1 IMPLANT
TOWEL OR 17X26 10 PK STRL BLUE (TOWEL DISPOSABLE) ×2 IMPLANT
TOWEL OR NON WOVEN STRL DISP B (DISPOSABLE) ×2 IMPLANT
TRAY FOLEY MTR SLVR 16FR STAT (SET/KITS/TRAYS/PACK) IMPLANT
TROCAR BLADELESS OPT 5 100 (ENDOMECHANICALS) IMPLANT
TUBING INSUFFLATION 10FT LAP (TUBING) ×2 IMPLANT

## 2019-07-16 NOTE — Transfer of Care (Signed)
Immediate Anesthesia Transfer of Care Note  Patient: Hannah Morton  Procedure(s) Performed: robotic cholecystectomy (N/A )  Patient Location: PACU  Anesthesia Type:General  Level of Consciousness: awake, alert  and oriented  Airway & Oxygen Therapy: Patient Spontanous Breathing and Patient connected to face mask oxygen  Post-op Assessment: Report given to RN, Post -op Vital signs reviewed and stable and Patient moving all extremities X 4  Post vital signs: Reviewed and stable  Last Vitals:  Vitals Value Taken Time  BP    Temp    Pulse    Resp    SpO2      Last Pain:  Vitals:   07/16/19 0547  TempSrc:   PainSc: 0-No pain         Complications: No apparent anesthesia complications

## 2019-07-16 NOTE — Anesthesia Procedure Notes (Signed)
Procedure Name: Intubation Date/Time: 07/16/2019 7:42 AM Performed by: Niel Hummer, CRNA Pre-anesthesia Checklist: Patient identified, Emergency Drugs available, Suction available and Patient being monitored Patient Re-evaluated:Patient Re-evaluated prior to induction Oxygen Delivery Method: Circle system utilized Preoxygenation: Pre-oxygenation with 100% oxygen Induction Type: IV induction Ventilation: Mask ventilation without difficulty Laryngoscope Size: Mac and 4 Grade View: Grade I Tube type: Oral Tube size: 7.0 mm Number of attempts: 1 Airway Equipment and Method: Stylet Placement Confirmation: positive ETCO2,  breath sounds checked- equal and bilateral and ETT inserted through vocal cords under direct vision Secured at: 22 cm Tube secured with: Tape Dental Injury: Teeth and Oropharynx as per pre-operative assessment

## 2019-07-16 NOTE — Op Note (Signed)
Operative Note  Hannah Morton 29 y.o. female [id number]  07/16/2019  Surgeon: Berna Bue MD FACS  Procedure performed: [facility] Robotic Cholecystectomy  Preop diagnosis: biliary colic Post-op diagnosis/intraop findings: same, chronic cholecystitis  Specimens: gallbladder  Retained items: none  EBL: minimal  Complications: none  Description of procedure: After obtaining informed consent the patient was brought to the operating room. Antibiotics were administered. SCD's were applied. General endotracheal anesthesia was initiated and a formal time-out was performed. The abdomen was prepped and draped in the usual sterile fashion and the abdomen was entered using a periumbilical Veress needle after instilling the site with local. Insufflation to was obtained, 40mm trocar and camera inserted, and gross inspection revealed no evidence of injury from our entry or other intraabdominal abnormalities.  Bilateral laparoscopic assisted taps blocks were performed with Exparel mixed with quarter percent Marcaine, and then 3 additional 66mm trocars were introduced under direct visualization and following infiltration with local.  The patient was positioned in reverse Trendelenburg and the robot was docked, robotic instruments inserted under direct visualization.  The gallbladder was retracted cephalad and the infundibulum was retracted laterally. A combination of hook electrocautery and blunt dissection was utilized to clear the peritoneum from the neck and cystic duct, circumferentially isolating the cystic artery and cystic duct and lifting the gallbladder from the cystic plate. The critical view of safety was achieved with the cystic artery, cystic duct, and liver bed visualized between them with no other structures.  Additionally the common bile duct was visible and was well away from the area of dissection.  This was concordant with the view on firefly.  The artery was clipped with a single clip  proximally and cauterized distally and divided. The cystic duct was ligated with three clips proximally and 1 distally and divided sharply. The gallbladder was dissected from the liver plate using electrocautery. Once freed the gallbladder was placed in an endocatch bag and removed  through the left-sided trocar site. A small amount of bleeding on the liver bed was controlled with cautery. Some bile had been spilled from the gallbladder during its dissection from the liver bed. This was aspirated and the right upper quadrant was irrigated copiously until the effluent was clear. Hemostasis was once again confirmed, and reinspection of the abdomen revealed no injuries. The clips were well opposed without any bile leak from the duct or the liver bed either on direct visualization nor with firefly. The abdomen was desufflated and all trocars removed. The skin incisions were closed with subcuticular monocryl and Dermabond. The patient was awakened, extubated and transported to the recovery room in stable condition.    All counts were correct at the completion of the case.

## 2019-07-16 NOTE — Discharge Instructions (Signed)
LAPAROSCOPIC SURGERY: POST OP INSTRUCTIONS  ######################################################################  EAT Gradually transition to a high fiber diet with a fiber supplement over the next few weeks after discharge.  Start with a pureed / full liquid diet (see below)  WALK Walk an hour a day.  Control your pain to do that.    CONTROL PAIN Control pain so that you can walk, sleep, tolerate sneezing/coughing, go up/down stairs.  HAVE A BOWEL MOVEMENT DAILY Keep your bowels regular to avoid problems.  OK to try a laxative to override constipation.  OK to use an antidairrheal to slow down diarrhea.  Call if not better after 2 tries  CALL IF YOU HAVE PROBLEMS/CONCERNS Call if you are still struggling despite following these instructions. Call if you have concerns not answered by these instructions  ######################################################################    1. DIET: Follow a light bland diet & liquids the first 24 hours after arrival home, such as soup, liquids, starches, etc.  Be sure to drink plenty of fluids.  Quickly advance to a usual solid diet within a few days.  Avoid fast food or heavy meals as your are more likely to get nauseated or have irregular bowels.  A low-sugar, high-fiber diet for the rest of your life is ideal.  2. Take your usually prescribed home medications unless otherwise directed.  3. PAIN CONTROL: a. Pain is best controlled by a usual combination of three different methods TOGETHER: i. Ice/Heat ii. Over the counter pain medication iii. Prescription pain medication b. Most patients will experience some swelling and bruising around the incisions.  Ice packs or heating pads (30-60 minutes up to 6 times a day) will help. Use ice for the first few days to help decrease swelling and bruising, then switch to heat to help relax tight/sore spots and speed recovery.  Some people prefer to use ice alone, heat alone, alternating between ice & heat.   Experiment to what works for you.  Swelling and bruising can take several weeks to resolve.   c. It is helpful to take an over-the-counter pain medication regularly for the first few days: i. Naproxen (Aleve, etc)  Two 220mg  tabs twice a day OR Ibuprofen (Advil, etc) Three 200mg  tabs four times a day (every meal & bedtime) AND ii. Acetaminophen (Tylenol, etc) 500-650mg  four times a day (every meal & bedtime) d. A  prescription for pain medication (such as oxycodone, hydrocodone, tramadol, gabapentin, methocarbamol, etc) should be given to you upon discharge.  Take your pain medication as prescribed, if needed.  i. If you are having problems/concerns with the prescription medicine (does not control pain, nausea, vomiting, rash, itching, etc), please call [phone] to see if we need to switch you to a different pain medicine that will work better for you and/or control your side effect better. ii. If you need a refill on your pain medication, please give [place] 48 hour notice.  contact your pharmacy.  They will contact our office to request authorization. Prescriptions will not be filled after 5 pm or on week-ends  4. Avoid getting constipated.   a. Between the surgery and the pain medications, it is common to experience some constipation.   b. Increasing fluid intake and taking a fiber supplement (such as Metamucil, Citrucel, FiberCon, MiraLax, etc) 1-2 times a day regularly will usually help prevent this problem from occurring.   c. A mild laxative (prune juice, Milk of Magnesia, MiraLax, etc) should be taken according to package directions if there are no bowel movements  after 48 hours.   5. Watch out for diarrhea.   a. If you have many loose bowel movements, simplify your diet to bland foods & liquids for a few days.   b. Stop any stool softeners and decrease your fiber supplement.   c. Switching to mild anti-diarrheal medications (Kayopectate, Pepto Bismol) can help.   d. If this worsens or  does not improve, please call us.  6. Wash / shower every day.  You may shower over the skin glue which is waterproof.  Continue to shower over incision(s) after the dressing is off.  7. Glue will flake off after about 2 weeks.  You may leave the incisions open to air.  You may replace a dressing/Band-Aid to cover the incision for comfort if you wish.   8. ACTIVITIES as tolerated:   a. You may resume regular (light) daily activities beginning the next day--such as daily self-care, walking, climbing stairs--gradually increasing activities as tolerated.  If you can walk 30 minutes without difficulty, it is safe to try more intense activity such as jogging, treadmill, bicycling, low-impact aerobics, swimming, etc. b. Save the most intensive and strenuous activity for last such as sit-ups, heavy lifting, contact sports, etc  Refrain from any heavy lifting or straining until you are off narcotics for pain control.   c. DO NOT PUSH THROUGH PAIN.  Let pain be your guide: If it hurts to do something, don't do it.  Pain is your body warning you to avoid that activity for another week until the pain goes down. d. You may drive when you are no longer taking prescription pain medication, you can comfortably wear a seatbelt, and you can safely maneuver your car and apply brakes. e. Dennis Bast may have sexual intercourse when it is comfortable.  9. FOLLOW UP in our office a. Please call [facility] at [phone] to set up an appointment to see your surgeon in the office for a follow-up appointment approximately 2-3 weeks after your surgery. b. Make sure that you call for this appointment the day you arrive home to insure a convenient appointment time.  10. IF YOU HAVE DISABILITY OR FAMILY LEAVE FORMS, BRING THEM TO THE OFFICE FOR PROCESSING.  DO NOT GIVE THEM TO YOUR DOCTOR.   WHEN TO CALL us [phone]: 1. Poor pain control 2. Reactions / problems with new medications (rash/itching, nausea, etc)  3. Fever over  101.5 F (38.5 C) 4. Inability to urinate 5. Nausea and/or vomiting 6. Worsening swelling or bruising 7. Continued bleeding from incision. 8. Increased pain, redness, or drainage from the incision   The clinic staff is available to answer your questions during regular business hours (8:30am-5pm).  Please don't hesitate to call and ask to speak to one of our nurses for clinical concerns.   If you have a medical emergency, go to the nearest emergency room or call 911.  A surgeon from [facility] is always on call at the [facility], [place], [place], [place], [place]  [id number] ? MAIN: [phone] ? TOLL FREE: [phone] ?  FAX [phone] [email or web address]

## 2019-07-16 NOTE — Anesthesia Postprocedure Evaluation (Signed)
Anesthesia Post Note  Patient: Hannah Morton  Procedure(s) Performed: robotic cholecystectomy (N/A )     Patient location during evaluation: PACU Anesthesia Type: General Level of consciousness: awake and alert Pain management: pain level controlled Vital Signs Assessment: post-procedure vital signs reviewed and stable Respiratory status: spontaneous breathing, nonlabored ventilation and respiratory function stable Cardiovascular status: blood pressure returned to baseline and stable Postop Assessment: no apparent nausea or vomiting Anesthetic complications: no    Last Vitals:  Vitals:   07/16/19 1000 07/16/19 1012  BP: (!) 140/92 (!) 160/98  Pulse: 66 63  Resp: 16 12  Temp: 36.6 C 36.6 C  SpO2: 98% 100%    Last Pain:  Vitals:   07/16/19 1012  TempSrc: Oral  PainSc:                  Lucretia Kern

## 2019-07-16 NOTE — Interval H&P Note (Signed)
History and Physical Interval Note:  07/16/2019 7:06 AM  Hannah Morton  has presented today for surgery, with the diagnosis of cholecystitis.  The various methods of treatment have been discussed with the patient and family. After consideration of risks, benefits and other options for treatment, the patient has consented to  Procedure(s): robotic cholecystectomy (N/A) as a surgical intervention.  The patient's history has been reviewed, patient examined, no change in status, stable for surgery.  I have reviewed the patient's chart and labs.  Questions were answered to the patient's satisfaction.     Kaylin Schellenberg Lollie Sails

## 2019-07-17 ENCOUNTER — Ambulatory Visit: Payer: Medicaid Other | Admitting: Internal Medicine

## 2019-07-17 ENCOUNTER — Encounter: Payer: Self-pay | Admitting: Internal Medicine

## 2019-07-17 VITALS — BP 100/70 | HR 80 | Temp 98.9°F | Ht 60.0 in | Wt 194.0 lb

## 2019-07-17 DIAGNOSIS — K219 Gastro-esophageal reflux disease without esophagitis: Secondary | ICD-10-CM | POA: Diagnosis not present

## 2019-07-17 DIAGNOSIS — K5900 Constipation, unspecified: Secondary | ICD-10-CM

## 2019-07-17 DIAGNOSIS — R1084 Generalized abdominal pain: Secondary | ICD-10-CM

## 2019-07-17 LAB — SURGICAL PATHOLOGY

## 2019-07-17 MED ORDER — PANTOPRAZOLE SODIUM 40 MG PO TBEC
40.0000 mg | DELAYED_RELEASE_TABLET | Freq: Every day | ORAL | 3 refills | Status: DC
Start: 2019-07-17 — End: 2019-09-27

## 2019-07-17 NOTE — Patient Instructions (Signed)
We have sent the following medications to your pharmacy for you to pick up at your convenience:  Pantoprazole.  You have been given some samples of Linzess .  If this works well, call back and I will send in a prescription.  Please follow up on _________________________

## 2019-07-17 NOTE — Progress Notes (Signed)
HISTORY OF PRESENT ILLNESS:  Hannah Morton is a very pleasant 29 y.o. 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  [place], the patient underwent laparoscopic cholecystectomy yesterday.  She scheduled this appointment some weeks back regarding her chronic abdominal complaints.  She was evaluated in the emergency room June 10, 2019 regarding right upper quadrant pain.  Laboratories at that time revealed mildly elevated ALT of 59.  Otherwise normal comprehensive metabolic panel.  Normal CBC with white blood cell count 9.2.  Normal lipase.  Abdominal ultrasound revealed multiple gallstones.  Normal common bile duct.  Laparoscopic cholecystectomy yesterday was robot-assisted.  I reviewed the operative report.  No IOC.  She feels like she is recovering well from her surgery.  Anticipated discomfort around the abdominal wound sites.  In terms of constipation she states that she has had it for years.  Describes it as severe.  She will get abdominal cramping discomfort with severe constipation generally relieved with defecation.  Generally has 2 bowel movements per week.  Occasionally associated with this is nausea and vomiting.  No bleeding.  She has tried limited MiraLAX without success.  She has not been exercising, though just had a child.  She describes the abdominal discomfort as squeezing in nature.  Terms of reflux, she has fairly frequent symptoms.  No dysphagia.  There has been abdominal bloating.  REVIEW OF SYSTEMS:  All non-GI ROS negative unless otherwise stated in the HPI except for anxiety, headaches, menstrual pain, skin rash  Past Medical History:  Diagnosis Date  . Frequent headaches   . Migraines   . Obesity (BMI 35.0-39.9 without comorbidity)     Past Surgical History:  Procedure Laterality Date  . WISDOM TOOTH EXTRACTION      Social History Hannah Morton  reports that she has never smoked. She has never used smokeless tobacco. She reports current alcohol use of about 2.0 standard drinks of alcohol per week. She reports that she does not use drugs.  family history includes Alcohol abuse in her brother and mother; Arthritis in her maternal grandmother; Asthma in her maternal grandmother; Birth defects in her maternal grandmother; COPD in her maternal grandmother and mother; Cancer in her maternal grandmother; Diabetes in her father and maternal grandmother; Heart attack in her paternal grandfather; Heart disease in her maternal grandmother; Hyperlipidemia in her maternal grandmother; Hypertension in her brother and mother; Stroke in her maternal grandfather and paternal grandfather.  Allergies  Allergen Reactions  . Codeine Rash       PHYSICAL EXAMINATION: Vital signs: BP 100/70   Pulse 80   Temp 98.9 F (37.2 C)   Ht 5' (1.524 m)   Wt 194 lb (88 kg)   LMP 06/30/2019 (Approximate)   BMI 37.89 kg/m   Constitutional: generally well-appearing, no acute distress Psychiatric: alert and oriented x3, cooperative Eyes: extraocular movements intact, anicteric, conjunctiva pink Mouth: oral pharynx moist, no lesions Neck: supple no lymphadenopathy Cardiovascular: heart regular rate and rhythm, no murmur Lungs: clear to auscultation bilaterally Abdomen: soft, obese nontender, nondistended, no obvious ascites, no peritoneal signs, normal bowel sounds, no organomegaly.  Abdominal wound sites unremarkable Rectal: Omitted Extremities: no clubbing, cyanosis, or lower extremity edema bilaterally Skin: no lesions on visible extremities Neuro: No focal deficits.  Cranial nerves intact  ASSESSMENT:  1.  Chronic constipation associated with abdominal discomfort. 2.  Chronic GERD without alarm features 3.  Status post cholecystectomy yesterday 4.  Obesity   PLAN:  1.  Increase fiber and water 2.  Linzess 145 mcg daily.  Samples given.  May  increase or decrease dosage as needed.  Will prescribe if this is helpful for her 3.  Prescribe pantoprazole 40 mg daily 4.  Reflux precautions 5.  Weight loss 6.  Office follow-up in 6 weeks.  Contact the office in the interim for any questions or problems A total time of 45 minutes was required preparing to see the patient, reviewing laboratory test, reviewing x-rays, reviewing operative notes, obtaining comprehensive history, performing comprehensive physical examination, counseling the patient regarding her above listed issues, ordering medications, and documenting clinical information as well as follow-up in the health record.

## 2019-08-27 ENCOUNTER — Ambulatory Visit: Payer: Medicaid Other | Admitting: Internal Medicine

## 2019-09-06 ENCOUNTER — Other Ambulatory Visit: Payer: Self-pay | Admitting: Surgery

## 2019-09-17 ENCOUNTER — Other Ambulatory Visit: Payer: Self-pay | Admitting: Surgery

## 2019-09-17 DIAGNOSIS — R109 Unspecified abdominal pain: Secondary | ICD-10-CM

## 2019-09-27 ENCOUNTER — Ambulatory Visit (INDEPENDENT_AMBULATORY_CARE_PROVIDER_SITE_OTHER): Payer: Medicaid Other | Admitting: Physician Assistant

## 2019-09-27 ENCOUNTER — Encounter: Payer: Self-pay | Admitting: Physician Assistant

## 2019-09-27 VITALS — BP 118/71 | HR 71 | Ht 60.0 in | Wt 207.5 lb

## 2019-09-27 DIAGNOSIS — R1084 Generalized abdominal pain: Secondary | ICD-10-CM | POA: Diagnosis not present

## 2019-09-27 DIAGNOSIS — K5900 Constipation, unspecified: Secondary | ICD-10-CM

## 2019-09-27 MED ORDER — LINACLOTIDE 145 MCG PO CAPS
145.0000 ug | ORAL_CAPSULE | Freq: Every day | ORAL | 3 refills | Status: DC
Start: 2019-09-27 — End: 2020-06-08

## 2019-09-27 NOTE — Progress Notes (Signed)
Chief Complaint: Follow-up GERD and constipation  HPI:    Hannah Morton is a 29 year old female with a past medical history as listed below, known to Dr. Marina Goodell, who returns to clinic today for follow-up of her GERD and constipation.    07/17/2019 patient was seen in clinic by Dr. Marina Goodell.  At that time she was being seen the day after undergoing a laparoscopic cholecystectomy.  She is scheduled for GI appointment weeks back regarding her chronic abdominal complaints.  Was evaluated in the ER March regarding right upper quadrant pain with mildly elevated ALT.  Abdominal ultrasound revealed multiple gallstones and she underwent lap cholecystectomy on 07/16/2019.  Described constipation for years with 2 bowel movements a week occasionally associated with nausea and vomiting.  At that time told to increase fiber and water and given Linzess 145 mcg daily.  Also was given pantoprazole 40 mg daily for chronic reflux without alarm features.    Today, the patient returns to clinic and explains that she never tried the samples of Linzess because she had "a lot going on".  She had just had her gallbladder out and had a 37-month-old child at home.  Also never started the Pantoprazole because she was not really sure what it was for.  Tells me that ever since time of her cholecystectomy she has continued with the right upper quadrant pain which feels different than previous and the surgeon has ordered a CT of her abdomen and pelvis to be done on Monday.  She continues with her chronic lower abdominal pain describing this as constipation with occasional "twisting of my bowel" up inside which gives her a lot of abdominal pain before a bowel movement.    Denies fever, chills or blood in her stool.  Past Medical History:  Diagnosis Date  . Frequent headaches   . Migraines   . Obesity (BMI 35.0-39.9 without comorbidity)     Past Surgical History:  Procedure Laterality Date  . WISDOM TOOTH EXTRACTION      Current  Outpatient Medications  Medication Sig Dispense Refill  . carboxymethylcellulose (REFRESH PLUS) 0.5 % SOLN Place 1 drop into both eyes 3 (three) times daily as needed (For dryness).    . milk thistle 175 MG tablet Take 175 mg by mouth daily.    . Multiple Vitamins-Minerals (MULTIVITAMIN WITH MINERALS) tablet Take 1 tablet by mouth daily.    . ondansetron (ZOFRAN) 4 MG tablet Take 1 tablet (4 mg total) by mouth every 8 (eight) hours as needed for nausea or vomiting. 10 tablet 0  . pantoprazole (PROTONIX) 40 MG tablet Take 1 tablet (40 mg total) by mouth daily. 90 tablet 3  . traMADol (ULTRAM) 50 MG tablet Take 1 tablet (50 mg total) by mouth every 6 (six) hours as needed. (Patient not taking: Reported on 07/17/2019) 15 tablet 0   No current facility-administered medications for this visit.    Allergies as of 09/27/2019 - Review Complete 07/17/2019  Allergen Reaction Noted  . Codeine Rash 01/06/2013    Family History  Problem Relation Age of Onset  . Alcohol abuse Mother   . COPD Mother   . Hypertension Mother   . Diabetes Father   . Alcohol abuse Brother   . Hypertension Brother   . Arthritis Maternal Grandmother   . Asthma Maternal Grandmother   . Birth defects Maternal Grandmother   . Cancer Maternal Grandmother   . COPD Maternal Grandmother   . Diabetes Maternal Grandmother   . Heart disease Maternal  Grandmother   . Hyperlipidemia Maternal Grandmother   . Stroke Maternal Grandfather   . Stroke Paternal Grandfather   . Heart attack Paternal Grandfather     Social History   Socioeconomic History  . Marital status: Married    Spouse name: Not on file  . Number of children: 3  . Years of education: Not on file  . Highest education level: Not on file  Occupational History  . Occupation: Center coordinator/CNA    Employer: [employer]  Tobacco Use  . Smoking status: Never Smoker  . Smokeless tobacco: Never Used  . Tobacco comment: Only when drinking  Vaping  Use  . Vaping Use: Never used  Substance and Sexual Activity  . Alcohol use: Yes    Alcohol/week: 2.0 standard drinks    Types: 2 Standard drinks or equivalent per week    Comment: socially  . Drug use: No  . Sexual activity: Yes    Birth control/protection: None  Other Topics Concern  . Not on file  Social History Narrative   Patient is a married mother of 2 sons.   She works as a Lawyer.     She may have 1-2 alcoholic drinks a week, but never smoked.   She does try to go walking with her sons on occasion, but does not do routine exercise   Social Determinants of Health   Financial Resource Strain:   . Difficulty of Paying Living Expenses:   Food Insecurity:   . Worried About Programme researcher, broadcasting/film/video in the Last Year:   . Barista in the Last Year:   Transportation Needs:   . Freight forwarder (Medical):   Marland Kitchen Lack of Transportation (Non-Medical):   Physical Activity:   . Days of Exercise per Week:   . Minutes of Exercise per Session:   Stress:   . Feeling of Stress :   Social Connections:   . Frequency of Communication with Friends and Family:   . Frequency of Social Gatherings with Friends and Family:   . Attends Religious Services:   . Active Member of Clubs or Organizations:   . Attends Banker Meetings:   Marland Kitchen Marital Status:   Intimate Partner Violence:   . Fear of Current or Ex-Partner:   . Emotionally Abused:   Marland Kitchen Physically Abused:   . Sexually Abused:     Review of Systems:    Constitutional: No weight loss, fever or chills Cardiovascular: No chest pain Respiratory: No SOB  Gastrointestinal: See HPI and otherwise negative   Physical Exam:  Vital signs: BP 118/71   Pulse 71   Ht 5' (1.524 m)   Wt 207 lb 8 oz (94.1 kg)   BMI 40.52 kg/m   Constitutional:   Pleasant overweight AA female appears to be in NAD, Well developed, Well nourished, alert and cooperative  Respiratory: Respirations even and unlabored. Lungs clear to auscultation  bilaterally.   No wheezes, crackles, or rhonchi.  Cardiovascular: Normal S1, S2. No MRG. Regular rate and rhythm. No peripheral edema, cyanosis or pallor.  Gastrointestinal:  Soft, nondistended, moderate RUQ ttp, No rebound or guarding. Normal bowel sounds. No appreciable masses or hepatomegaly. Rectal:  Not performed.  Psychiatric: Demonstrates good judgement and reason without abnormal affect or behaviors.  RELEVANT LABS AND IMAGING: CBC    Component Value Date/Time   WBC 8.6 07/12/2019 1510   RBC 4.47 07/12/2019 1510   HGB 12.7 07/12/2019 1510   HCT 38.4 07/12/2019  1510   PLT 276 07/12/2019 1510   MCV 85.9 07/12/2019 1510   MCH 28.4 07/12/2019 1510   MCHC 33.1 07/12/2019 1510   RDW 16.1 (H) 07/12/2019 1510   LYMPHSABS 3.3 06/11/2019 0002   MONOABS 0.6 06/11/2019 0002   EOSABS 0.3 06/11/2019 0002   BASOSABS 0.0 06/11/2019 0002    CMP     Component Value Date/Time   NA 138 06/11/2019 0002   K 3.5 06/11/2019 0002   CL 102 06/11/2019 0002   CO2 27 06/11/2019 0002   GLUCOSE 85 06/11/2019 0002   BUN 13 06/11/2019 0002   CREATININE 0.70 06/11/2019 0002   CALCIUM 9.2 06/11/2019 0002   PROT 8.0 06/11/2019 0002   ALBUMIN 3.9 06/11/2019 0002   AST 41 06/11/2019 0002   ALT 59 (H) 06/11/2019 0002   ALKPHOS 80 06/11/2019 0002   BILITOT 0.6 06/11/2019 0002   GFRNONAA >60 06/11/2019 0002   GFRAA >60 06/11/2019 0002    Assessment: 1.  Right upper quadrant pain: Continues after cholecystectomy in May, surgeon has ordered a CT for Monday; consider postop complication versus constipation+/-gas pain 2.  Left lower quadrant cramping pain: Typically with constipation; consider IBS 3.  Constipation  Plan: 1.  Discussed with the patient today that using Linzess can help with cramping pain as well as constipation.  Would recommend that she do the Linzess 145 mcg daily 30 minutes before meal.  Provided her with a prescription today so she can start this and see if it helps. 2.  We will  see what the CT shows on Monday. 3.  Discussed with patient that if the Linzess is not helping then we may need to consider endoscopic work-up in the future. 4.  Patient was given an appointment with me in 2 months.  At that time we will have results from CT and she will have given the Linzess a trial.  Also told her to stay in touch with me via MyChart.  Hyacinth Meeker, PA-C [facility] 09/27/2019, 3:29 PM  Cc: Mliss Sax

## 2019-09-27 NOTE — Patient Instructions (Signed)
If you are age 30 or older, your body mass index should be between 23-30. Your Body mass index is 40.52 kg/m. If this is out of the aforementioned range listed, please consider follow up with your Primary Care Provider.  If you are age 33 or younger, your body mass index should be between 19-25. Your Body mass index is 40.52 kg/m. If this is out of the aformentioned range listed, please consider follow up with your Primary Care Provider.   We have sent the following medications to your pharmacy for you to pick up at your convenience: Linzess 145 mcg daily.   Follow up in 2 months.

## 2019-09-27 NOTE — Progress Notes (Signed)
Assessment and plans noted ?

## 2019-09-30 ENCOUNTER — Ambulatory Visit
Admission: RE | Admit: 2019-09-30 | Discharge: 2019-09-30 | Disposition: A | Discharge status: Home / Self Care | Payer: Medicaid Other | Source: Ambulatory Visit | Attending: Surgery | Admitting: Surgery

## 2019-09-30 DIAGNOSIS — R109 Unspecified abdominal pain: Secondary | ICD-10-CM

## 2019-09-30 MED ORDER — IOPAMIDOL (ISOVUE-300) INJECTION 61%
100.0000 mL | Freq: Once | INTRAVENOUS | Status: AC | PRN
  Administered 2019-09-30: 100 mL via INTRAVENOUS

## 2019-10-07 ENCOUNTER — Other Ambulatory Visit: Payer: Medicaid Other

## 2020-04-29 ENCOUNTER — Ambulatory Visit (HOSPITAL_COMMUNITY): Payer: Self-pay

## 2020-06-05 ENCOUNTER — Telehealth (HOSPITAL_BASED_OUTPATIENT_CLINIC_OR_DEPARTMENT_OTHER): Payer: Self-pay

## 2020-06-05 NOTE — Telephone Encounter (Signed)
Thank you for choosing Primary Care and Sports Medicine Department at [facility] at [facility]. This first scheduled appointment is just to establish care with our office since you have never been seen by our providers before. We will go over your past medical history, any medications and immediate concerns you may have. If you have additional concerns the provider can possibly get to it that day but they may have to schedule a follow up appointment. If you will please give our office a call at [phone] or you can message back here through MyChart.

## 2020-06-08 ENCOUNTER — Other Ambulatory Visit: Payer: Self-pay

## 2020-06-08 ENCOUNTER — Ambulatory Visit (HOSPITAL_BASED_OUTPATIENT_CLINIC_OR_DEPARTMENT_OTHER)
Admission: RE | Admit: 2020-06-08 | Discharge: 2020-06-08 | Disposition: A | Discharge status: Home / Self Care | Payer: Medicaid Other | Source: Ambulatory Visit | Attending: Family Medicine | Admitting: Family Medicine

## 2020-06-08 ENCOUNTER — Ambulatory Visit (INDEPENDENT_AMBULATORY_CARE_PROVIDER_SITE_OTHER): Payer: Medicaid Other | Admitting: Family Medicine

## 2020-06-08 ENCOUNTER — Encounter (HOSPITAL_BASED_OUTPATIENT_CLINIC_OR_DEPARTMENT_OTHER): Payer: Self-pay | Admitting: Family Medicine

## 2020-06-08 ENCOUNTER — Other Ambulatory Visit (HOSPITAL_BASED_OUTPATIENT_CLINIC_OR_DEPARTMENT_OTHER)
Admission: RE | Admit: 2020-06-08 | Discharge: 2020-06-08 | Disposition: A | Discharge status: Home / Self Care | Payer: Medicaid Other | Source: Ambulatory Visit | Attending: Family Medicine | Admitting: Family Medicine

## 2020-06-08 DIAGNOSIS — Z6839 Body mass index (BMI) 39.0-39.9, adult: Secondary | ICD-10-CM

## 2020-06-08 DIAGNOSIS — M79662 Pain in left lower leg: Secondary | ICD-10-CM | POA: Diagnosis not present

## 2020-06-08 DIAGNOSIS — R202 Paresthesia of skin: Secondary | ICD-10-CM

## 2020-06-08 DIAGNOSIS — Z600 Problems of adjustment to life-cycle transitions: Secondary | ICD-10-CM | POA: Diagnosis not present

## 2020-06-08 DIAGNOSIS — E669 Obesity, unspecified: Secondary | ICD-10-CM | POA: Insufficient documentation

## 2020-06-08 DIAGNOSIS — E66813 Obesity, class 3: Secondary | ICD-10-CM | POA: Insufficient documentation

## 2020-06-08 LAB — CBC WITH DIFFERENTIAL/PLATELET
Abs Immature Granulocytes: 0.03 10*3/uL (ref 0.00–0.07)
Basophils Absolute: 0.1 10*3/uL (ref 0.0–0.1)
Basophils Relative: 1 %
Eosinophils Absolute: 0.3 10*3/uL (ref 0.0–0.5)
Eosinophils Relative: 2 %
HCT: 39.4 % (ref 36.0–46.0)
Hemoglobin: 12.8 g/dL (ref 12.0–15.0)
Immature Granulocytes: 0 %
Lymphocytes Relative: 33 %
Lymphs Abs: 3.7 10*3/uL (ref 0.7–4.0)
MCH: 28.2 pg (ref 26.0–34.0)
MCHC: 32.5 g/dL (ref 30.0–36.0)
MCV: 86.8 fL (ref 80.0–100.0)
Monocytes Absolute: 0.7 10*3/uL (ref 0.1–1.0)
Monocytes Relative: 6 %
Neutro Abs: 6.7 10*3/uL (ref 1.7–7.7)
Neutrophils Relative %: 58 %
Platelets: 295 10*3/uL (ref 150–400)
RBC: 4.54 MIL/uL (ref 3.87–5.11)
RDW: 14.9 % (ref 11.5–15.5)
WBC: 11.5 10*3/uL — ABNORMAL HIGH (ref 4.0–10.5)
nRBC: 0 % (ref 0.0–0.2)

## 2020-06-08 LAB — COMPREHENSIVE METABOLIC PANEL
ALT: 11 U/L (ref 0–44)
AST: 12 U/L — ABNORMAL LOW (ref 15–41)
Albumin: 4.5 g/dL (ref 3.5–5.0)
Alkaline Phosphatase: 49 U/L (ref 38–126)
Anion gap: 10 (ref 5–15)
BUN: 11 mg/dL (ref 6–20)
CO2: 24 mmol/L (ref 22–32)
Calcium: 8.9 mg/dL (ref 8.9–10.3)
Chloride: 104 mmol/L (ref 98–111)
Creatinine, Ser: 0.57 mg/dL (ref 0.44–1.00)
GFR, Estimated: >60 mL/min (ref >60)
Glucose, Bld: 73 mg/dL (ref 70–99)
Potassium: 3.3 mmol/L — ABNORMAL LOW (ref 3.5–5.1)
Sodium: 138 mmol/L (ref 135–145)
Total Bilirubin: 0.4 mg/dL (ref 0.3–1.2)
Total Protein: 7.4 g/dL (ref 6.5–8.1)

## 2020-06-08 LAB — VITAMIN B12: Vitamin B-12: 418 pg/mL (ref 180–914)

## 2020-06-08 NOTE — Assessment & Plan Note (Signed)
Discussed lifestyle modifications with patient today including dietary changes, increase physical activity Recommended least 150 minutes of moderate intensity aerobic exercise per week Discussed role of healthy diet, consider referral to nutritionist in the future if desired

## 2020-06-08 NOTE — Assessment & Plan Note (Signed)
Patient with negative PHQ 2, positive GAD-7 today Discussed these results as well as history of prior symptoms; patient has never been on specific treatment in the past whether pharmacotherapy or counseling Discussed options of pharmacotherapy and counseling with patient.  She is wanting to avoid pharmacotherapy, but will consider initiating counseling.  She will let us know if she would like referral for this in the future. We will continue to monitor symptoms at future visits.

## 2020-06-08 NOTE — Assessment & Plan Note (Signed)
Discussed that etiology is likely benign in nature, possible muscular strain.  Less likely related to DVT and reassurance provided regarding this.  Patient with moderate concern, order placed for vascular ultrasound to rule out DVT.  Recommend continue with conservative measures, home exercise program, stretching. Can utilize OTC medications as needed to help with pain control.

## 2020-06-08 NOTE — Assessment & Plan Note (Signed)
Uncertain etiology Physical exam normal and office today We will check labs Encourage patient to continue to monitor symptoms for any changes or progression

## 2020-06-08 NOTE — Progress Notes (Signed)
New Patient Office Visit  Subjective:  Patient ID: [name], female    DOB: 12/12/1990  Age: 30 y.o. MRN: [id number]  CC:  Chief Complaint  Patient presents with  . Establish Care  . Anxiety    Pt states she is overall anxious about her health. She has had pneumonia and a gall bladder removal in the last year post child birth.   . Muscle Pain    Pt complains of B/L lower extremity muscle pain. Worsening on the left side than right. Pt states she also has intermittent muscle pain in her arms. Pt states the pain has been present since she got back from a trip out of town in which she was on a prolonged train ride for 8 hours.    HPI ARMENTA ERSKIN is a 30 year old female presenting to establish in clinic.  She reports current issues related to calf pain, paresthesias.  Also with positive GAD-7 on intake.  Reports past history of intermittent depressive symptoms, symptoms, chest discomfort/tightness which has been related to stress.  Calf pain: First noticed a couple days after a train ride which lasted for 8 hours.  Pain is greater in the left versus right calf.  She is unsure of any swelling.  Has not tried any medications to help with the pain.  Reports some tingling/paresthesias in the lower extremities, left worse than right.  Does not exercise regularly but reports moderate exertion at her job.  She is a Production designer, theatre/television/film of a sauna.  GAD-7 was positive today with symptoms reportedly making things "somewhat difficult" for her to do her work, take care of things at home.  She reports a history of intermittent depressive and anxious symptoms in the past.  Pharmacotherapy has been discussed in the past but nothing was ever initiated.  Past Medical History:  Diagnosis Date  . Frequent headaches   . Migraines   . Obesity (BMI 35.0-39.9 without comorbidity)     Past Surgical History:  Procedure Laterality Date  . WISDOM TOOTH EXTRACTION      Family History  Problem Relation Age of  Onset  . Alcohol abuse Mother   . COPD Mother   . Hypertension Mother   . Diabetes Father   . Alcohol abuse Brother   . Hypertension Brother   . Arthritis Maternal Grandmother   . Asthma Maternal Grandmother   . Birth defects Maternal Grandmother   . Cancer Maternal Grandmother   . COPD Maternal Grandmother   . Diabetes Maternal Grandmother   . Heart disease Maternal Grandmother   . Hyperlipidemia Maternal Grandmother   . Stroke Maternal Grandfather   . Stroke Paternal Grandfather   . Heart attack Paternal Grandfather     Social History   Socioeconomic History  . Marital status: Married    Spouse name: Not on file  . Number of children: 3  . Years of education: Not on file  . Highest education level: Not on file  Occupational History  . Occupation: Center coordinator/CNA    Employer: [employer]  Tobacco Use  . Smoking status: Never Smoker  . Smokeless tobacco: Never Used  . Tobacco comment: Only when drinking  Vaping Use  . Vaping Use: Never used  Substance and Sexual Activity  . Alcohol use: Yes    Alcohol/week: 2.0 standard drinks    Types: 2 Standard drinks or equivalent per week    Comment: socially  . Drug use: No  . Sexual activity: Yes  Birth control/protection: None  Other Topics Concern  . Not on file  Social History Narrative   Patient is a married mother of 2 sons.   She works as a Lawyer.     She may have 1-2 alcoholic drinks a week, but never smoked.   She does try to go walking with her sons on occasion, but does not do routine exercise   Social Determinants of Health   Financial Resource Strain: Not on file  Food Insecurity: Not on file  Transportation Needs: Not on file  Physical Activity: Not on file  Stress: Not on file  Social Connections: Not on file  Intimate Partner Violence: Not on file    Objective:   Today's Vitals: BP 110/68   Pulse 83   Ht 5' (1.524 m)   Wt 203 lb (92.1 kg)   SpO2 100%   BMI 39.65 kg/m    Physical Exam  Pleasant 30 year old female in no acute distress Cardiovascular exam with regular rate and rhythm, no murmurs appreciated Lungs clear to auscultation bilaterally Mild tenderness through left calf, mostly over lateral aspect.  Circumference of left calf measured at 24.2 cm, circumference of right calf measured at 24.4 cm.  Trace pretibial edema appreciated in bilateral lower extremities.  Normal range of motion and strength at bilateral ankles.  Normal deep tendon reflexes at bilateral Achilles.  Distal neurovascular exam intact.  Assessment & Plan:   Problem List Items Addressed This Visit      Other   Pain of left calf    Discussed that etiology is likely benign in nature, possible muscular strain.  Less likely related to DVT and reassurance provided regarding this.  Patient with moderate concern, order placed for vascular ultrasound to rule out DVT.  Recommend continue with conservative measures, home exercise program, stretching. Can utilize OTC medications as needed to help with pain control.      Relevant Orders   US Venous Img Lower Unilateral Left (DVT)   CBC with Differential/Platelet   Lipid panel   Thyroid Panel With TSH   Comprehensive metabolic panel   Vitamin B12   Paresthesias    Uncertain etiology Physical exam normal and office today We will check labs Encourage patient to continue to monitor symptoms for any changes or progression      Relevant Orders   CBC with Differential/Platelet   Lipid panel   Thyroid Panel With TSH   Comprehensive metabolic panel   Vitamin B12   BMI 39.0-39.9,adult    Discussed lifestyle modifications with patient today including dietary changes, increase physical activity Recommended least 150 minutes of moderate intensity aerobic exercise per week Discussed role of healthy diet, consider referral to nutritionist in the future if desired      Relevant Orders   Lipid panel   Thyroid Panel With TSH    Comprehensive metabolic panel   Phase of life problem    Patient with negative PHQ 2, positive GAD-7 today Discussed these results as well as history of prior symptoms; patient has never been on specific treatment in the past whether pharmacotherapy or counseling Discussed options of pharmacotherapy and counseling with patient.  She is wanting to avoid pharmacotherapy, but will consider initiating counseling.  She will let us know if she would like referral for this in the future. We will continue to monitor symptoms at future visits.         Outpatient Encounter Medications as of 06/08/2020  Medication Sig  . Multiple Vitamins-Minerals (MULTIVITAMIN WITH  MINERALS) tablet Take 1 tablet by mouth daily.  . [DISCONTINUED] carboxymethylcellulose (REFRESH PLUS) 0.5 % SOLN Place 1 drop into both eyes 3 (three) times daily as needed (For dryness).  . [DISCONTINUED] linaclotide (LINZESS) 145 MCG CAPS capsule Take 1 capsule (145 mcg total) by mouth daily before breakfast.   No facility-administered encounter medications on file as of 06/08/2020.   Spent 60 minutes on this patient encounter, including preparation, chart review, face-to-face counseling with patient and coordination of care, and documentation of encounter  Follow-up: Return in about 3 months (around 09/08/2020) for follow up - in office.   Jerimey Burridge J De Peru, MD

## 2020-06-08 NOTE — Patient Instructions (Addendum)
  Medication Instructions:  Your physician recommends that you continue on your current medications as directed. Please refer to the Current Medication list given to you today. --If you need a refill on any your medications before your next appointment, please call your pharmacy first. If no refills are authorized on file call the office.--  Lab Work: Your physician has recommended that you have lab work today: CBC, Comprehensive Metabolic Panel, Lipid Profile, Thyroid Panel, and Vitamin B12 level If you have labs (blood work) drawn today and your tests are completely normal, you will receive your results only by: Marland Kitchen MyChart Message (if you have MyChart) OR . A phone call from our staff. Please ensure you check your voicemail in the event that you authorized detailed messages to be left on a delegated number. If you have any lab test that is abnormal or we need to change your treatment, we will call you to review the results.  Referral/Procedure/Imaging Your physician has requested that you have a lower venous doppler duplex. This test is an ultrasound of the veins in the legs or arms. It looks at venous blood flow that carries blood from the heart to the legs or arms. Allow one hour for a Lower Venous exam. Allow thirty minutes for an Upper Venous exam. There are no restrictions or special instructions.  Follow-Up: Your next appointment:   Your physician recommends that you schedule a follow-up appointment in: 3 MONTHS with Dr. De Peru  Thanks for letting us be apart of your health journey!!  Primary Care and Sports Medicine    Dr. de Peru and Shawna Clamp, DNP, AGNP  We recommend signing up for the patient portal called "MyChart".  Sign up information is provided on this After Visit Summary.  MyChart is used to connect with patients for Virtual Visits (Telemedicine).  Patients are able to view lab/test results, encounter notes, upcoming appointments, etc.  Non-urgent messages can be sent  to your provider as well.   To learn more about what you can do with MyChart, go to [email or web address].

## 2020-06-09 ENCOUNTER — Ambulatory Visit (HOSPITAL_BASED_OUTPATIENT_CLINIC_OR_DEPARTMENT_OTHER): Payer: Medicaid Other

## 2020-06-09 ENCOUNTER — Telehealth (HOSPITAL_BASED_OUTPATIENT_CLINIC_OR_DEPARTMENT_OTHER): Payer: Self-pay

## 2020-06-09 LAB — LIPID PANEL
Cholesterol: 151 mg/dL (ref 0–200)
HDL: 63 mg/dL (ref >40)
LDL Cholesterol: 79 mg/dL (ref 0–99)
Total CHOL/HDL Ratio: 2.4 RATIO
Triglycerides: 46 mg/dL (ref <150)
VLDL: 9 mg/dL (ref 0–40)

## 2020-06-09 NOTE — Telephone Encounter (Signed)
Results released by Dr. De Peru and reviewed by patient via MyChart Left voicemail instructing patient to contact the office with any questions or concerns.

## 2020-06-09 NOTE — Telephone Encounter (Signed)
-----   Message from Hosie Poisson Peru, MD sent at 06/09/2020  8:42 AM EDT ----- Ultrasound of the left calf did not reveal a clot in the vein which is reassuring. Pain is most likely musculoskeletal in nature and should improve with conservative measures.

## 2020-06-10 ENCOUNTER — Other Ambulatory Visit (HOSPITAL_BASED_OUTPATIENT_CLINIC_OR_DEPARTMENT_OTHER): Payer: Self-pay | Admitting: Family Medicine

## 2020-06-10 DIAGNOSIS — D72829 Elevated white blood cell count, unspecified: Secondary | ICD-10-CM

## 2020-06-10 DIAGNOSIS — E876 Hypokalemia: Secondary | ICD-10-CM

## 2020-06-10 LAB — THYROID PANEL WITH TSH
Free Thyroxine Index: 2.1 (ref 1.2–4.9)
T3 Uptake Ratio: 29 % (ref 24–39)
T4, Total: 7.2 ug/dL (ref 4.5–12.0)
TSH: 1.66 u[IU]/mL (ref 0.450–4.500)

## 2020-06-10 NOTE — Progress Notes (Signed)
Orders for BMP and CBC placed to check hypokalemia and leukocytosis

## 2020-06-11 ENCOUNTER — Telehealth (HOSPITAL_BASED_OUTPATIENT_CLINIC_OR_DEPARTMENT_OTHER): Payer: Self-pay

## 2020-06-11 DIAGNOSIS — E876 Hypokalemia: Secondary | ICD-10-CM

## 2020-06-11 DIAGNOSIS — D72829 Elevated white blood cell count, unspecified: Secondary | ICD-10-CM

## 2020-06-11 NOTE — Telephone Encounter (Signed)
Pt is aware and agreeable to lab results and recommendations Pt is agreeable to repeat labs on 07/06/20.  Orders in and appt scheduled

## 2020-06-11 NOTE — Telephone Encounter (Signed)
-----   Message from Hosie Poisson Peru, MD sent at 06/10/2020  1:27 PM EDT ----- Labs overall reassuring. Thyroid panel is normal. Cholesterol numbers are good. Vitamin B12 is normal. Slight elevation in white blood cell count. Slightly low potassium. Would incorporate potassium-rich food into diet including broccoli, avocado, oranges, bananas. Would like to recheck labs in about 4 weeks. Orders will be in the system and you can go by the lab anytime after 4 weeks.

## 2020-06-16 ENCOUNTER — Ambulatory Visit: Payer: Medicaid Other | Admitting: Internal Medicine

## 2020-06-19 ENCOUNTER — Ambulatory Visit: Payer: Medicaid Other | Attending: Critical Care Medicine

## 2020-06-19 DIAGNOSIS — Z20822 Contact with and (suspected) exposure to covid-19: Secondary | ICD-10-CM

## 2020-06-20 LAB — SARS-COV-2, NAA 2 DAY TAT

## 2020-06-20 LAB — NOVEL CORONAVIRUS, NAA: SARS-CoV-2, NAA: NOT DETECTED

## 2020-07-06 ENCOUNTER — Other Ambulatory Visit: Payer: Self-pay

## 2020-07-06 ENCOUNTER — Other Ambulatory Visit (HOSPITAL_BASED_OUTPATIENT_CLINIC_OR_DEPARTMENT_OTHER)
Admission: RE | Admit: 2020-07-06 | Discharge: 2020-07-06 | Disposition: A | Discharge status: Home / Self Care | Payer: Medicaid Other | Source: Ambulatory Visit | Attending: Family Medicine | Admitting: Family Medicine

## 2020-07-06 DIAGNOSIS — D72829 Elevated white blood cell count, unspecified: Secondary | ICD-10-CM

## 2020-07-06 DIAGNOSIS — E876 Hypokalemia: Secondary | ICD-10-CM | POA: Insufficient documentation

## 2020-07-06 LAB — BASIC METABOLIC PANEL
Anion gap: 6 (ref 5–15)
BUN: 18 mg/dL (ref 6–20)
CO2: 26 mmol/L (ref 22–32)
Calcium: 8.9 mg/dL (ref 8.9–10.3)
Chloride: 105 mmol/L (ref 98–111)
Creatinine, Ser: 0.61 mg/dL (ref 0.44–1.00)
GFR, Estimated: >60 mL/min (ref >60)
Glucose, Bld: 84 mg/dL (ref 70–99)
Potassium: 4 mmol/L (ref 3.5–5.1)
Sodium: 137 mmol/L (ref 135–145)

## 2020-07-06 LAB — CBC WITH DIFFERENTIAL/PLATELET
Abs Immature Granulocytes: 0.02 10*3/uL (ref 0.00–0.07)
Basophils Absolute: 0.1 10*3/uL (ref 0.0–0.1)
Basophils Relative: 1 %
Eosinophils Absolute: 0.3 10*3/uL (ref 0.0–0.5)
Eosinophils Relative: 3 %
HCT: 39.7 % (ref 36.0–46.0)
Hemoglobin: 12.9 g/dL (ref 12.0–15.0)
Immature Granulocytes: 0 %
Lymphocytes Relative: 30 %
Lymphs Abs: 2.9 10*3/uL (ref 0.7–4.0)
MCH: 28 pg (ref 26.0–34.0)
MCHC: 32.5 g/dL (ref 30.0–36.0)
MCV: 86.3 fL (ref 80.0–100.0)
Monocytes Absolute: 0.6 10*3/uL (ref 0.1–1.0)
Monocytes Relative: 7 %
Neutro Abs: 5.7 10*3/uL (ref 1.7–7.7)
Neutrophils Relative %: 59 %
Platelets: 267 10*3/uL (ref 150–400)
RBC: 4.6 MIL/uL (ref 3.87–5.11)
RDW: 14.5 % (ref 11.5–15.5)
WBC: 9.6 10*3/uL (ref 4.0–10.5)
nRBC: 0 % (ref 0.0–0.2)

## 2020-07-06 NOTE — Addendum Note (Signed)
Addended by: Rebbeca Paul C on: 07/06/2020 10:01 AM   Modules accepted: Orders

## 2020-07-07 ENCOUNTER — Telehealth (HOSPITAL_BASED_OUTPATIENT_CLINIC_OR_DEPARTMENT_OTHER): Payer: Self-pay

## 2020-07-07 ENCOUNTER — Other Ambulatory Visit (HOSPITAL_BASED_OUTPATIENT_CLINIC_OR_DEPARTMENT_OTHER): Payer: Self-pay | Admitting: Family Medicine

## 2020-07-07 NOTE — Telephone Encounter (Signed)
Patient is aware and agreeable to normal lab results Patient was relieved that labs returned to baseline and grateful for the call

## 2020-07-07 NOTE — Telephone Encounter (Signed)
-----   Message from Hosie Poisson Peru, MD sent at 07/07/2020  2:15 PM EDT ----- Potassium and white blood cell count have both returned to normal range and labs are overall reassuring

## 2020-07-13 ENCOUNTER — Other Ambulatory Visit (HOSPITAL_BASED_OUTPATIENT_CLINIC_OR_DEPARTMENT_OTHER): Payer: Medicaid Other

## 2020-07-13 ENCOUNTER — Ambulatory Visit (HOSPITAL_BASED_OUTPATIENT_CLINIC_OR_DEPARTMENT_OTHER): Payer: Medicaid Other | Admitting: Family Medicine

## 2020-08-23 ENCOUNTER — Encounter (HOSPITAL_COMMUNITY): Payer: Self-pay | Admitting: Emergency Medicine

## 2020-08-23 ENCOUNTER — Other Ambulatory Visit: Payer: Self-pay

## 2020-08-23 ENCOUNTER — Ambulatory Visit (HOSPITAL_COMMUNITY)
Admission: EM | Admit: 2020-08-23 | Discharge: 2020-08-23 | Disposition: A | Discharge status: Home / Self Care | Payer: Medicaid Other | Attending: Emergency Medicine | Admitting: Emergency Medicine

## 2020-08-23 DIAGNOSIS — M62830 Muscle spasm of back: Secondary | ICD-10-CM

## 2020-08-23 LAB — POCT URINALYSIS DIPSTICK, ED / UC
Bilirubin Urine: NEGATIVE
Glucose, UA: NEGATIVE mg/dL
Ketones, ur: NEGATIVE mg/dL
Leukocytes,Ua: NEGATIVE
Nitrite: NEGATIVE
Protein, ur: NEGATIVE mg/dL
Specific Gravity, Urine: 1.025 (ref 1.005–1.030)
Urobilinogen, UA: 0.2 mg/dL (ref 0.0–1.0)
pH: 5.5 (ref 5.0–8.0)

## 2020-08-23 LAB — POC URINE PREG, ED: Preg Test, Ur: NEGATIVE

## 2020-08-23 MED ORDER — TIZANIDINE HCL 4 MG PO TABS: 4.0000 mg | ORAL_TABLET | Freq: Four times a day (QID) | ORAL | 0 refills | Status: DC | PRN

## 2020-08-23 MED ORDER — IBUPROFEN 800 MG PO TABS: 800.0000 mg | ORAL_TABLET | Freq: Three times a day (TID) | ORAL | 0 refills | Status: DC

## 2020-08-23 NOTE — ED Triage Notes (Signed)
Pt presents with lower back pain xs 4 days. States reached over bath tub to bathe kids and felt a sharp pain in lower back States has use ice, soaks, and OTC medications with no relief.

## 2020-08-23 NOTE — ED Provider Notes (Signed)
[facility]    CSN: [id number] Arrival date & time: 08/23/20  1349      History   Chief Complaint Chief Complaint  Patient presents with   Back Pain    HPI Hannah Morton is a 30 y.o. female.   Patient here for evaluation of lower back pain that has been ongoing since Thursday.  Reports pain started after leaning over the bathtub today for her children.  Reports pain achy and constant.  Reports pain worse and sharp with movement.  Reports massage helps to improve pain minimally.  Has tried ice, soaks, and OTC medications with no relief.  Denies any numbness and tingling in her legs.  Denies any loss of bowel or bladder control.    Denies any trauma, injury, or other precipitating event.  Denies any specific alleviating or aggravating factors.  Denies any fevers, chest pain, shortness of breath, N/V/D, numbness, tingling, weakness, abdominal pain, or headaches.     The history is provided by the patient.  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 Authorizing  Provider  ibuprofen (ADVIL) 800 MG tablet Take 1 tablet (800 mg total) by mouth 3 (three) times daily. 08/23/20  Yes Ivette Loyal, NP  tiZANidine (ZANAFLEX) 4 MG tablet Take 1 tablet (4 mg total) by mouth every 6 (six) hours as needed for muscle spasms. 08/23/20  Yes Ivette Loyal, NP  Multiple Vitamins-Minerals (MULTIVITAMIN WITH MINERALS) tablet Take 1 tablet by mouth daily.    [provider]    Family History Family History  Problem Relation Age of Onset   Alcohol abuse Mother    COPD Mother    Hypertension Mother    Diabetes Father    Alcohol abuse Brother    Hypertension Brother    Arthritis Maternal Grandmother    Asthma Maternal Grandmother    Birth defects Maternal Grandmother    Cancer Maternal Grandmother    COPD Maternal Grandmother    Diabetes Maternal Grandmother    Heart disease Maternal Grandmother    Hyperlipidemia Maternal Grandmother    Stroke Maternal Grandfather    Stroke Paternal Grandfather    Heart attack Paternal Grandfather     Social History Social History   Tobacco Use   Smoking status: Never  Smokeless tobacco: Never   Tobacco comments:    Only when drinking  Vaping Use   Vaping Use: Never used  Substance Use Topics   Alcohol use: Yes    Alcohol/week: 2.0 standard drinks    Types: 2 Standard drinks or equivalent per week    Comment: socially   Drug use: No     Allergies   Codeine   Review of Systems Review of Systems  Musculoskeletal:  Positive for back pain. Negative for myalgias and neck pain.  Neurological:  Negative for tremors, weakness and numbness.  All other systems reviewed and are negative.   Physical Exam Triage Vital Signs ED Triage Vitals  Enc Vitals Group     BP 08/23/20 1434 (!) 138/55     Pulse Rate 08/23/20 1434 94     Resp 08/23/20 1434 17     Temp 08/23/20 1434 98.2 F (36.8 C)     Temp Source 08/23/20 1434 Oral     SpO2 08/23/20 1434 100 %     Weight --      Height --      Head  Circumference --      Peak Flow --      Pain Score 08/23/20 1431 7     Pain Loc --      Pain Edu? --      Excl. in GC? --    No data found.  Updated Vital Signs BP (!) 138/55 (BP Location: Left Arm)   Pulse 94   Temp 98.2 F (36.8 C) (Oral)   Resp 17   LMP 08/14/2020   SpO2 100%   Visual Acuity Right Eye Distance:   Left Eye Distance:   Bilateral Distance:    Right Eye Near:   Left Eye Near:    Bilateral Near:     Physical Exam Vitals and nursing note reviewed.  Constitutional:      General: She is not in acute distress.    Appearance: Normal appearance. She is not ill-appearing, toxic-appearing or diaphoretic.  HENT:     Head: Normocephalic and atraumatic.  Eyes:     Conjunctiva/sclera: Conjunctivae normal.  Cardiovascular:     Rate and Rhythm: Normal rate.     Pulses: Normal pulses.  Pulmonary:     Effort: Pulmonary effort is normal.  Abdominal:     General: Abdomen is flat.     Tenderness: There is no right CVA tenderness or left CVA tenderness.  Musculoskeletal:        General: Normal range of motion.     Cervical back: Normal range of motion.     Lumbar back: Spasms and tenderness present. No swelling or bony tenderness. Normal range of motion. Negative right straight leg raise test and negative left straight leg raise test.  Skin:    General: Skin is warm and dry.  Neurological:     General: No focal deficit present.     Mental Status: She is alert and oriented to person, place, and time.  Psychiatric:        Mood and Affect: Mood normal.     UC Treatments / Results  Labs (all labs ordered are listed, but only abnormal results are displayed) Labs Reviewed  POCT URINALYSIS DIPSTICK, ED / UC - Abnormal; Notable for the following components:      Result Value   Hgb urine dipstick TRACE (*)    All other components within normal limits  POC URINE PREG, ED    EKG  Radiology No results found.  Procedures Procedures (including critical care  time)  Medications Ordered in UC Medications - No data to display  Initial Impression / Assessment and Plan / UC Course  I have reviewed the triage vital signs and the nursing notes.  Pertinent labs & imaging results that were available during my care of the patient were reviewed by me and considered in my medical decision making (see chart for details).    Assessment negative for red flags or concerns.  Urinalysis negative for signs of infection and test negative.  This is likely muscle spasms.  Will treat with ibuprofen and Zanaflex as needed for pain and muscle spasms.  Encourage rest and heat for comfort.  Follow-up if symptoms worsen or do not improve. Final Clinical Impressions(s) / UC Diagnoses   Final diagnoses:  Muscle spasm of back     Discharge Instructions      Take the Ibuprofen three times a day as needed for pain.    Take the Zanaflex as needed for muscle pain or spasms.  It can make you sleepy so don't take it prior to driving.    Rest as much as possible You can use heat or a warm compress for comfort.    Return or go to the Emergency Department if symptoms worsen or do not improve in the next few days.       ED Prescriptions     Medication Sig Dispense Auth. Provider   ibuprofen (ADVIL) 800 MG tablet Take 1 tablet (800 mg total) by mouth 3 (three) times daily. 21 tablet Ivette Loyal, NP   tiZANidine (ZANAFLEX) 4 MG tablet Take 1 tablet (4 mg total) by mouth every 6 (six) hours as needed for muscle spasms. 30 tablet Ivette Loyal, NP      PDMP not reviewed this encounter.   Ivette Loyal, NP 08/23/20 1515

## 2020-08-23 NOTE — Discharge Instructions (Addendum)
Take the Ibuprofen three times a day as needed for pain.    Take the Zanaflex as needed for muscle pain or spasms.  It can make you sleepy so don't take it prior to driving.    Rest as much as possible You can use heat or a warm compress for comfort.    Return or go to the Emergency Department if symptoms worsen or do not improve in the next few days.

## 2020-08-27 ENCOUNTER — Encounter (HOSPITAL_BASED_OUTPATIENT_CLINIC_OR_DEPARTMENT_OTHER): Payer: Self-pay

## 2020-08-27 ENCOUNTER — Ambulatory Visit (HOSPITAL_BASED_OUTPATIENT_CLINIC_OR_DEPARTMENT_OTHER): Payer: Medicaid Other | Admitting: Family Medicine

## 2020-08-27 ENCOUNTER — Telehealth (HOSPITAL_BASED_OUTPATIENT_CLINIC_OR_DEPARTMENT_OTHER): Payer: Self-pay | Admitting: Family Medicine

## 2020-08-27 NOTE — Telephone Encounter (Signed)
Needed to cancel appt on 6/17 due to Provider emergency. She already had another appt on 6/27. Called to see if she would like to be seen on 6/24 for both concerns

## 2020-09-07 ENCOUNTER — Encounter (HOSPITAL_BASED_OUTPATIENT_CLINIC_OR_DEPARTMENT_OTHER): Payer: Self-pay

## 2020-09-07 ENCOUNTER — Ambulatory Visit (HOSPITAL_BASED_OUTPATIENT_CLINIC_OR_DEPARTMENT_OTHER): Payer: Medicaid Other | Admitting: Family Medicine

## 2020-11-24 ENCOUNTER — Other Ambulatory Visit: Payer: Self-pay

## 2020-11-24 ENCOUNTER — Encounter (HOSPITAL_COMMUNITY): Payer: Self-pay

## 2020-11-24 ENCOUNTER — Ambulatory Visit (HOSPITAL_COMMUNITY)
Admission: EM | Admit: 2020-11-24 | Discharge: 2020-11-24 | Disposition: A | Discharge status: Home / Self Care | Payer: Medicaid Other | Attending: Student | Admitting: Student

## 2020-11-24 DIAGNOSIS — H66001 Acute suppurative otitis media without spontaneous rupture of ear drum, right ear: Secondary | ICD-10-CM | POA: Diagnosis not present

## 2020-11-24 MED ORDER — AMOXICILLIN 875 MG PO TABS: 875.0000 mg | ORAL_TABLET | Freq: Two times a day (BID) | ORAL | 0 refills | Status: AC

## 2020-11-24 MED ORDER — FLUTICASONE PROPIONATE 50 MCG/ACT NA SUSP: 2.0000 | Freq: Every day | NASAL | 2 refills | Status: DC

## 2020-11-24 NOTE — ED Provider Notes (Signed)
[facility]    CSN: [id number] Arrival date & time: 11/24/20  1431      History   Chief Complaint Chief Complaint  Patient presents with   Otalgia    HPI Hannah Morton is a 30 y.o. female presenting with ear pain following positive COVID test 11/17/2020.  Medical history obesity, headaches.  States she is actually feeling significantly better, but the right ear pain has persisted.  Denies hearing changes, dizziness, tinnitus.  Pain inside and around the right ear.  Denies fever/chills, cough, congestion.  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 Authorizing Provider  amoxicillin (AMOXIL) 875 MG tablet Take 1 tablet (875 mg total) by mouth 2 (two) times daily for 7 days. 11/24/20 12/01/20 Yes Rhys Martini, PA-C  fluticasone (FLONASE) 50 MCG/ACT nasal spray Place 2 sprays into both nostrils daily. 11/24/20  Yes Rhys Martini, PA-C  ibuprofen (ADVIL) 800 MG tablet Take 1 tablet (800 mg total) by mouth 3 (three) times daily. 08/23/20   Ivette Loyal, NP  Multiple Vitamins-Minerals (MULTIVITAMIN WITH MINERALS) tablet  Take 1 tablet by mouth daily.    [provider]  tiZANidine (ZANAFLEX) 4 MG tablet Take 1 tablet (4 mg total) by mouth every 6 (six) hours as needed for muscle spasms. 08/23/20   Ivette Loyal, NP    Family History Family History  Problem Relation Age of Onset   Alcohol abuse Mother    COPD Mother    Hypertension Mother    Diabetes Father    Alcohol abuse Brother    Hypertension Brother    Arthritis Maternal Grandmother    Asthma Maternal Grandmother    Birth defects Maternal Grandmother    Cancer Maternal Grandmother    COPD Maternal Grandmother    Diabetes Maternal Grandmother    Heart disease Maternal Grandmother    Hyperlipidemia Maternal Grandmother    Stroke Maternal Grandfather    Stroke Paternal Grandfather    Heart attack Paternal Grandfather     Social History Social History   Tobacco Use   Smoking status: Never   Smokeless tobacco: Never   Tobacco comments:    Only when drinking  Vaping Use   Vaping Use: Never used  Substance Use Topics   Alcohol use: Yes    Alcohol/week: 2.0 standard drinks  Types: 2 Standard drinks or equivalent per week    Comment: socially   Drug use: No     Allergies   Codeine   Review of Systems Review of Systems  Constitutional:  Negative for appetite change, chills and fever.  HENT:  Positive for congestion and ear pain. Negative for rhinorrhea, sinus pressure, sinus pain and sore throat.   Eyes:  Negative for redness and visual disturbance.  Respiratory:  Negative for cough, chest tightness, shortness of breath and wheezing.   Cardiovascular:  Negative for chest pain and palpitations.  Gastrointestinal:  Negative for abdominal pain, constipation, diarrhea, nausea and vomiting.  Genitourinary:  Negative for dysuria, frequency and urgency.  Musculoskeletal:  Negative for myalgias.  Neurological:  Negative for dizziness, weakness and headaches.  Psychiatric/Behavioral:  Negative for confusion.   All other  systems reviewed and are negative.   Physical Exam Triage Vital Signs ED Triage Vitals  Enc Vitals Group     BP      Pulse      Resp      Temp      Temp src      SpO2      Weight      Height      Head Circumference      Peak Flow      Pain Score      Pain Loc      Pain Edu?      Excl. in GC?    No data found.  Updated Vital Signs BP 122/81 (BP Location: Left Arm)   Pulse 65   Temp 98 F (36.7 C) (Oral)   Resp 18   SpO2 100%   Visual Acuity Right Eye Distance:   Left Eye Distance:   Bilateral Distance:    Right Eye Near:   Left Eye Near:    Bilateral Near:     Physical Exam Vitals reviewed.  Constitutional:      General: She is not in acute distress.    Appearance: Normal appearance. She is not ill-appearing.  HENT:     Head: Normocephalic and atraumatic.     Right Ear: Ear canal and external ear normal. No tenderness. No middle ear effusion. There is no impacted cerumen. Tympanic membrane is erythematous and bulging. Tympanic membrane is not perforated or retracted.     Left Ear: Tympanic membrane, ear canal and external ear normal. No tenderness.  No middle ear effusion. There is no impacted cerumen. Tympanic membrane is not perforated, erythematous, retracted or bulging.     Ears:     Comments: R preauricular lymphadenopathy     Nose: Nose normal. No congestion.     Mouth/Throat:     Mouth: Mucous membranes are moist.     Pharynx: Uvula midline. No oropharyngeal exudate or posterior oropharyngeal erythema.  Eyes:     Extraocular Movements: Extraocular movements intact.     Pupils: Pupils are equal, round, and reactive to light.  Cardiovascular:     Rate and Rhythm: Normal rate and regular rhythm.     Heart sounds: Normal heart sounds.  Pulmonary:     Effort: Pulmonary effort is normal.     Breath sounds: Normal breath sounds. No decreased breath sounds, wheezing, rhonchi or rales.  Abdominal:     Palpations: Abdomen is soft.     Tenderness: There  is no abdominal tenderness. There is no guarding or rebound.  Neurological:     General: No focal deficit present.  Mental Status: She is alert and oriented to person, place, and time.  Psychiatric:        Mood and Affect: Mood normal.        Behavior: Behavior normal.        Thought Content: Thought content normal.        Judgment: Judgment normal.     UC Treatments / Results  Labs (all labs ordered are listed, but only abnormal results are displayed) Labs Reviewed - No data to display  EKG   Radiology No results found.  Procedures Procedures (including critical care time)  Medications Ordered in UC Medications - No data to display  Initial Impression / Assessment and Plan / UC Course  I have reviewed the triage vital signs and the nursing notes.  Pertinent labs & imaging results that were available during my care of the patient were reviewed by me and considered in my medical decision making (see chart for details).     This patient is a very pleasant 30 y.o. year old female presenting with R AOM following covid-19. Afebrile, nontachy.  Covid positive 11/17/2020, symptoms have largely resolved. IUD contraception, States she is not pregnant or breastfeeding.  Amoxicillin, flonase.   ED return precautions discussed. Patient verbalizes understanding and agreement.    Final Clinical Impressions(s) / UC Diagnoses   Final diagnoses:  Non-recurrent acute suppurative otitis media of right ear without spontaneous rupture of tympanic membrane     Discharge Instructions      -Start the antibiotic-Amoxicillin, 1 pill every 12 hours for 7 days.  You can take this with food like with breakfast and dinner. -Flonase nasal steroid: place 2 sprays into both nostrils in the morning and at bedtime for at least 7 days. Continue for longer if this is helping.      ED Prescriptions     Medication Sig Dispense Auth. Provider   amoxicillin (AMOXIL) 875 MG tablet Take 1  tablet (875 mg total) by mouth 2 (two) times daily for 7 days. 14 tablet Rhys Martini, PA-C   fluticasone [facility]) 50 MCG/ACT nasal spray Place 2 sprays into both nostrils daily. 15 mL Rhys Martini, PA-C      PDMP not reviewed this encounter.   Rhys Martini, PA-C 11/24/20 1552

## 2020-11-24 NOTE — ED Triage Notes (Signed)
Pt in with c/o right ear pain for a few days  Pt used otc ear drops with no relief  States it felt clogged earlier but she pulled on it and it opened up

## 2020-11-24 NOTE — Discharge Instructions (Addendum)
-  Start the antibiotic-Amoxicillin, 1 pill every 12 hours for 7 days.  You can take this with food like with breakfast and dinner. -Flonase nasal steroid: place 2 sprays into both nostrils in the morning and at bedtime for at least 7 days. Continue for longer if this is helping.  

## 2020-12-07 ENCOUNTER — Other Ambulatory Visit: Payer: Self-pay

## 2020-12-07 ENCOUNTER — Encounter (HOSPITAL_BASED_OUTPATIENT_CLINIC_OR_DEPARTMENT_OTHER): Payer: Self-pay | Admitting: Family Medicine

## 2020-12-07 ENCOUNTER — Ambulatory Visit (INDEPENDENT_AMBULATORY_CARE_PROVIDER_SITE_OTHER): Payer: Medicaid Other | Admitting: Family Medicine

## 2020-12-07 DIAGNOSIS — R21 Rash and other nonspecific skin eruption: Secondary | ICD-10-CM

## 2020-12-07 DIAGNOSIS — Z8616 Personal history of COVID-19: Secondary | ICD-10-CM | POA: Diagnosis not present

## 2020-12-07 DIAGNOSIS — L298 Other pruritus: Secondary | ICD-10-CM | POA: Insufficient documentation

## 2020-12-07 NOTE — Assessment & Plan Note (Addendum)
Patient with concerns related to small papules located on left upper extremity.  She also has redness, irritation within right axilla. Patient was diagnosed with coronavirus infection a few weeks ago and subsequently developed right ear pain.  She was evaluated in urgent care and was diagnosed with a right ear infection and was prescribed amoxicillin.  She reports that she noticed a rash developing after starting antibiotics.  Did complete course of antibiotics Generally she is feeling improved with improved right ear pain as well as improved symptoms from coronavirus infection.  Still with some mild shortness of breath, fatigue. On exam, she has 2 small papules noted on left upper extremity.  She has photos of irritation and redness of right axilla.  She also has bruise noted over medial right knee Discussed that rash does not appear to be related to antibiotic use Recommend using alternative deodorant for now to see if this helps improve irritation of right axilla.  If not, recommend returning to office for further evaluation as it is possible this could be infectious

## 2020-12-07 NOTE — Progress Notes (Signed)
    Procedures performed today:    None.  Independent interpretation of notes and tests performed by another provider:   None.  Brief History, Exam, Impression, and Recommendations:    BP 106/80   Pulse (!) 116   Ht 5' (1.524 m)   Wt 201 lb (91.2 kg)   SpO2 98%   BMI 39.26 kg/m   Rash Patient with concerns related to small papules located on left upper extremity.  She also has redness, irritation within right axilla. Patient was diagnosed with coronavirus infection a few weeks ago and subsequently developed right ear pain.  She was evaluated in urgent care and was diagnosed with a right ear infection and was prescribed amoxicillin.  She reports that she noticed a rash developing after starting antibiotics.  Did complete course of antibiotics Generally she is feeling improved with improved right ear pain as well as improved symptoms from coronavirus infection.  Still with some mild shortness of breath, fatigue. On exam, she has 2 small papules noted on left upper extremity.  She has photos of irritation and redness of right axilla.  She also has bruise noted over medial right knee Discussed that rash does not appear to be related to antibiotic use Recommend using alternative deodorant for now to see if this helps improve irritation of right axilla.  If not, recommend returning to office for further evaluation as it is possible this could be infectious  History of 2019 novel coronavirus disease (COVID-19) Has been generally improving, but still with some lingering symptoms On exam, she appears well, no acute distress, no increased work of breathing Discussed that symptoms can persist as she recovers from coronavirus infection Would generally expect breathing, fatigue to improve over the coming weeks and gradually resolve Continue with expectant management  Plan for follow-up in about 2 to 3 months or sooner as needed.  Follow-up on prior issues from March  visit   ___________________________________________ Saina Waage de [place], MD, ABFM, CAQSM Primary Care and Sports Medicine [facility]

## 2020-12-07 NOTE — Assessment & Plan Note (Signed)
Has been generally improving, but still with some lingering symptoms On exam, she appears well, no acute distress, no increased work of breathing Discussed that symptoms can persist as she recovers from coronavirus infection Would generally expect breathing, fatigue to improve over the coming weeks and gradually resolve Continue with expectant management

## 2020-12-07 NOTE — Patient Instructions (Signed)
°  Medication Instructions:  °Your physician recommends that you continue on your current medications as directed. Please refer to the Current Medication list given to you today. °--If you need a refill on any your medications before your next appointment, please call your pharmacy first. If no refills are authorized on file call the office.-- ° °Follow-Up: °Your next appointment:   °Your physician recommends that you schedule a follow-up appointment in: 2-3 MONTHS with Dr. de [place] ° °You will receive a text message or e-mail with a link to a survey about your care and experience with us today! We would greatly appreciate your feedback!  ° °Thanks for letting us be apart of your health journey!!  °Primary Care and Sports Medicine  ° °Dr. Raymond de [place]  ° °We encourage you to activate your patient portal called "MyChart".  Sign up information is provided on this After Visit Summary.  MyChart is used to connect with patients for Virtual Visits (Telemedicine).  Patients are able to view lab/test results, encounter notes, upcoming appointments, etc.  Non-urgent messages can be sent to your provider as well. To learn more about what you can do with MyChart, please visit --  [email or web address]   ° °

## 2020-12-27 ENCOUNTER — Encounter (HOSPITAL_COMMUNITY): Payer: Self-pay

## 2020-12-27 ENCOUNTER — Emergency Department (HOSPITAL_COMMUNITY)
Admission: EM | Admit: 2020-12-27 | Discharge: 2020-12-27 | Disposition: A | Discharge status: Home / Self Care | Payer: Medicaid Other | Attending: Emergency Medicine | Admitting: Emergency Medicine

## 2020-12-27 ENCOUNTER — Other Ambulatory Visit: Payer: Self-pay

## 2020-12-27 ENCOUNTER — Emergency Department (HOSPITAL_COMMUNITY): Payer: Medicaid Other

## 2020-12-27 DIAGNOSIS — Z8616 Personal history of COVID-19: Secondary | ICD-10-CM | POA: Insufficient documentation

## 2020-12-27 DIAGNOSIS — Z975 Presence of (intrauterine) contraceptive device: Secondary | ICD-10-CM | POA: Diagnosis not present

## 2020-12-27 DIAGNOSIS — R1031 Right lower quadrant pain: Secondary | ICD-10-CM | POA: Diagnosis not present

## 2020-12-27 DIAGNOSIS — N9489 Other specified conditions associated with female genital organs and menstrual cycle: Secondary | ICD-10-CM | POA: Diagnosis not present

## 2020-12-27 LAB — CBC WITH DIFFERENTIAL/PLATELET
Abs Immature Granulocytes: 0.02 10*3/uL (ref 0.00–0.07)
Basophils Absolute: 0 10*3/uL (ref 0.0–0.1)
Basophils Relative: 1 %
Eosinophils Absolute: 0.2 10*3/uL (ref 0.0–0.5)
Eosinophils Relative: 3 %
HCT: 40.2 % (ref 36.0–46.0)
Hemoglobin: 13.2 g/dL (ref 12.0–15.0)
Immature Granulocytes: 0 %
Lymphocytes Relative: 35 %
Lymphs Abs: 2.7 10*3/uL (ref 0.7–4.0)
MCH: 28.6 pg (ref 26.0–34.0)
MCHC: 32.8 g/dL (ref 30.0–36.0)
MCV: 87.2 fL (ref 80.0–100.0)
Monocytes Absolute: 0.6 10*3/uL (ref 0.1–1.0)
Monocytes Relative: 7 %
Neutro Abs: 4.2 10*3/uL (ref 1.7–7.7)
Neutrophils Relative %: 54 %
Platelets: 214 10*3/uL (ref 150–400)
RBC: 4.61 MIL/uL (ref 3.87–5.11)
RDW: 14 % (ref 11.5–15.5)
WBC: 7.7 10*3/uL (ref 4.0–10.5)
nRBC: 0 % (ref 0.0–0.2)

## 2020-12-27 LAB — URINALYSIS, ROUTINE W REFLEX MICROSCOPIC
Bacteria, UA: NONE SEEN
Bilirubin Urine: NEGATIVE
Glucose, UA: NEGATIVE mg/dL
Hgb urine dipstick: NEGATIVE
Ketones, ur: NEGATIVE mg/dL
Nitrite: NEGATIVE
Protein, ur: NEGATIVE mg/dL
Specific Gravity, Urine: 1.018 (ref 1.005–1.030)
pH: 6 (ref 5.0–8.0)

## 2020-12-27 LAB — BASIC METABOLIC PANEL
Anion gap: 4 — ABNORMAL LOW (ref 5–15)
BUN: 18 mg/dL (ref 6–20)
CO2: 24 mmol/L (ref 22–32)
Calcium: 8.7 mg/dL — ABNORMAL LOW (ref 8.9–10.3)
Chloride: 107 mmol/L (ref 98–111)
Creatinine, Ser: 0.63 mg/dL (ref 0.44–1.00)
GFR, Estimated: >60 mL/min (ref >60)
Glucose, Bld: 91 mg/dL (ref 70–99)
Potassium: 4.3 mmol/L (ref 3.5–5.1)
Sodium: 135 mmol/L (ref 135–145)

## 2020-12-27 LAB — I-STAT BETA HCG BLOOD, ED (MC, WL, AP ONLY): I-stat hCG, quantitative: <5 m[IU]/mL (ref <5)

## 2020-12-27 MED ORDER — ACETAMINOPHEN 325 MG PO TABS
650.0000 mg | ORAL_TABLET | Freq: Once | ORAL | Status: AC
  Administered 2020-12-27: 650 mg via ORAL
  Filled 2020-12-27: qty 2

## 2020-12-27 NOTE — ED Triage Notes (Addendum)
Pt states right sided flank pain that wraps to her groin. Pt states that it started this morning. Pt states pain worse with palpation. Pt states she has had issues with bladder emptying. Pt was also concerned about issues with her IUD

## 2020-12-27 NOTE — Discharge Instructions (Addendum)
You are seen in the emergency department today for abdominal pain.  We gave you Tylenol, and I am reassured that there was some improvement in your pain.  All of the lab work and urine test that we did today were all normal.  Your ultrasound was normal as well.  It showed that your copper IUD is in place without complications.  Please use Tylenol or ibuprofen for pain.  You may use 600 mg ibuprofen every 6 hours or 1000 mg of Tylenol every 6 hours.  You may choose to alternate between the 2.  This would be most effective.  Not to exceed 4 g of Tylenol within 24 hours.  Not to exceed 3200 mg ibuprofen 24 hours.  Think it is incredibly important you follow-up with your gynecologist to discuss long-term contraception management.  Continue to monitor how you're doing and return to the ER for new or worsening symptoms such as worsening abdominal pain, persistent nausea or vomiting, vaginal bleeding or pelvic pain.   It has been a pleasure seeing and caring for you today and I hope you start feeling better soon!

## 2020-12-27 NOTE — ED Provider Notes (Signed)
[facility]-EMERGENCY DEPT Provider Note   CSN: [id number] Arrival date & time: 12/27/20  3354     History Chief Complaint  Patient presents with   Flank Pain   Groin Pain    Hannah Morton is a 30 y.o. female with history of GERD who presents with right lower quadrant pain starting this morning.  Patient states that she initially noticed right flank pain when she woke up, was able to go back to sleep.  She woke up again with significant abdominal pain.  She states that the pain is a constant aching, but comes and worsening waves.  When its at its worst she rates it a 9 out of 10.  Pain is made worse with palpation, no other aggravating or alleviating factors.  She has not tried anything for her symptoms.  She does have a copper IUD in place, and is concerned this may be contributing to her symptoms.  Last bowel movement was this morning and was normal. Feels she has difficulty fully emptying her bladder. No history of kidney stones. No fevers, chills, nausea, vomiting, diarrhea, constipation.  No dysuria, hematuria, vaginal discharge or bleeding.    Flank Pain Associated symptoms include abdominal pain.  Groin Pain Associated symptoms include abdominal pain.      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   SVD (spontaneous vaginal delivery) 09/04/2013    Past Surgical History:  Procedure Laterality Date   WISDOM TOOTH EXTRACTION       OB  History     Gravida  4   Para  3   Term  3   Preterm      AB  1   Living  3      SAB      IAB  1   Ectopic      Multiple  0   Live Births  3           Family History  Problem Relation Age of Onset   Alcohol abuse Mother    COPD Mother    Hypertension Mother    Diabetes Father    Alcohol abuse Brother    Hypertension Brother    Arthritis Maternal Grandmother    Asthma Maternal Grandmother    Birth defects Maternal Grandmother    Cancer Maternal Grandmother    COPD Maternal Grandmother    Diabetes Maternal Grandmother    Heart disease Maternal Grandmother    Hyperlipidemia Maternal Grandmother    Stroke Maternal Grandfather    Stroke Paternal Grandfather    Heart attack Paternal Grandfather     Social History   Tobacco Use   Smoking status: Never   Smokeless tobacco: Never   Tobacco comments:    Only when drinking  Vaping Use   Vaping Use: Never used  Substance Use Topics   Alcohol use: Yes    Alcohol/week: 2.0 standard drinks    Types: 2 Standard drinks or equivalent per week  Comment: socially   Drug use: No    Home Medications Prior to Admission medications   Medication Sig Start Date End Date Taking? Authorizing Provider  fluticasone (FLONASE) 50 MCG/ACT nasal spray Place 2 sprays into both nostrils daily.    [provider]  Multiple Vitamins-Minerals (MULTIVITAMIN WITH MINERALS) tablet Take 1 tablet by mouth daily.    [provider]    Allergies    Codeine  Review of Systems   Review of Systems  Constitutional:  Negative for chills and fever.  Gastrointestinal:  Positive for abdominal pain. Negative for nausea and vomiting.  Genitourinary:  Positive for difficulty urinating and flank pain. Negative for dysuria, hematuria, urgency, vaginal bleeding, vaginal discharge and vaginal pain.  All other systems reviewed and are negative.  Physical Exam Updated Vital Signs BP 124/83   Pulse 71   Temp (!) 97.3 F  (36.3 C) (Oral)   Resp 16   Ht 5' (1.524 m)   Wt 90.7 kg   LMP 12/20/2020   SpO2 100%   BMI 39.06 kg/m   Physical Exam Vitals and nursing note reviewed.  Constitutional:      Appearance: Normal appearance.  HENT:     Head: Normocephalic and atraumatic.  Eyes:     Conjunctiva/sclera: Conjunctivae normal.  Cardiovascular:     Rate and Rhythm: Normal rate and regular rhythm.  Pulmonary:     Effort: Pulmonary effort is normal. No respiratory distress.     Breath sounds: Normal breath sounds.  Abdominal:     General: There is no distension.     Palpations: Abdomen is soft.     Tenderness: There is abdominal tenderness in the right lower quadrant. There is right CVA tenderness. There is no guarding or rebound.     Hernia: No hernia is present.  Skin:    General: Skin is warm and dry.  Neurological:     General: No focal deficit present.     Mental Status: She is alert.    ED Results / Procedures / Treatments   Labs (all labs ordered are listed, but only abnormal results are displayed) Labs Reviewed  URINALYSIS, ROUTINE W REFLEX MICROSCOPIC - Abnormal; Notable for the following components:      Result Value   Leukocytes,Ua TRACE (*)    All other components within normal limits  BASIC METABOLIC PANEL - Abnormal; Notable for the following components:   Calcium 8.7 (*)    Anion gap 4 (*)    All other components within normal limits  CBC WITH DIFFERENTIAL/PLATELET  I-STAT BETA HCG BLOOD, ED ([facility], [facility], [facility] ONLY)    EKG None  Radiology US PELVIC COMPLETE W TRANSVAGINAL AND TORSION R/O  Result Date: 12/27/2020 CLINICAL DATA:  Right lower quadrant pain, IUD EXAM: TRANSABDOMINAL AND TRANSVAGINAL ULTRASOUND OF PELVIS DOPPLER ULTRASOUND OF OVARIES TECHNIQUE: Both transabdominal and transvaginal ultrasound examinations of the pelvis were performed. Transabdominal technique was performed for global imaging of the pelvis including uterus, ovaries, adnexal regions, and pelvic  cul-de-sac. It was necessary to proceed with endovaginal exam following the transabdominal exam to visualize the endometrium. Color and duplex Doppler ultrasound was utilized to evaluate blood flow to the ovaries. COMPARISON:  None. FINDINGS: Uterus Measurements: 8.4 x 4.3 x 5.3 cm = volume: 96 mL. No fibroids or other mass visualized. Endometrium Thickness: 2 mm. No focal abnormality visualized. IUD present in the fundal endometrial cavity. Right ovary Measurements: 2.9 x 2.5 x 2.1 cm = volume: 8 mL. Small follicles. Normal appearance/no  adnexal mass. Left ovary Measurements: 2.4 x 1.5 x 2.3 cm = volume: 4 mL. Small follicles. Normal appearance/no adnexal mass. Pulsed Doppler evaluation of both ovaries demonstrates normal low-resistance arterial and venous waveforms. Other findings No abnormal free fluid. IMPRESSION: 1. No ultrasound abnormality of the pelvis to explain right lower quadrant pain. Consider CT or MRI to further evaluate otherwise unexplained abdominal pain. 2. IUD present in the fundal endometrial cavity. Electronically Signed   By: Jearld Lesch M.D.   On: 12/27/2020 12:26    Procedures Procedures   Medications Ordered in ED Medications  acetaminophen (TYLENOL) tablet 650 mg (650 mg Oral Given 12/27/20 1300)    ED Course  I have reviewed the triage vital signs and the nursing notes.  Pertinent labs & imaging results that were available during my care of the patient were reviewed by me and considered in my medical decision making (see chart for details).    MDM Rules/Calculators/A&P                           Patient is 30 y/o female who presents with right flank and RLQ pain since this morning.  On exam she is afebrile, with tenderness to palpation in the right lower quadrant, right groin, and right flank.  States that she has difficulty emptying her bladder fully. No dysuria.  Differential diagnosis of her lower abdominal considerations include pelvic inflammatory disease,  ectopic pregnancy, appendicitis, urinary calculi, primary dysmenorrhea, septic abortion, ruptured ovarian cyst or tumor, ovarian torsion, tubo-ovarian abscess, degeneration of fibroid, endometriosis, diverticulitis, cystitis.   CBC shows no leukocytosis. Urinalysis negative for UTI. Pregnancy test negative.  Pelvic ultrasound to rule out torsion was negative, no other acute findings.  Copper IUD in place with no complications.  Low suspicion for infectious etiology.  Suspicion for kidney stone due to pattern of symptoms, no hematuria on urinalysis.  Reevaluation patient states that her pain is mildly improved.  Her abdomen remains nonsurgical.  She believes source of her pain is from her copper IUD, is planning to make an appointment with her GYN to have it removed.  She is not requiring admission or inpatient treatment for symptoms at this time.  Plan to discharge home with symptomatic management, and outpatient follow-up.  Discussed reasons to return to the emergency department.  Patient agreeable to plan.  Final Clinical Impression(s) / ED Diagnoses Final diagnoses:  RLQ abdominal pain  IUD (intrauterine device) in place    Rx / DC Orders ED Discharge Orders     None        Yajahira Tison T, PA-C 12/27/20 1330    Derwood Kaplan, MD 12/27/20 1441

## 2021-03-01 ENCOUNTER — Ambulatory Visit (INDEPENDENT_AMBULATORY_CARE_PROVIDER_SITE_OTHER): Payer: Medicaid Other | Admitting: Family Medicine

## 2021-03-01 ENCOUNTER — Other Ambulatory Visit: Payer: Self-pay

## 2021-03-01 ENCOUNTER — Encounter (HOSPITAL_BASED_OUTPATIENT_CLINIC_OR_DEPARTMENT_OTHER): Payer: Self-pay | Admitting: Family Medicine

## 2021-03-01 VITALS — BP 104/74 | HR 58 | Ht 60.0 in | Wt 202.0 lb

## 2021-03-01 DIAGNOSIS — L659 Nonscarring hair loss, unspecified: Secondary | ICD-10-CM

## 2021-03-01 DIAGNOSIS — Z0000 Encounter for general adult medical examination without abnormal findings: Secondary | ICD-10-CM

## 2021-03-01 NOTE — Patient Instructions (Signed)
°  Medication Instructions:  Your physician recommends that you continue on your current medications as directed. Please refer to the Current Medication list given to you today. --If you need a refill on any your medications before your next appointment, please call your pharmacy first. If no refills are authorized on file call the office.--  Referrals/Procedures/Imaging: A referral has been placed for you to [facility] for evaluation and treatment. Someone from the scheduling department will be in contact with you in regards to coordinating your consultation. If you do not hear from any of the schedulers within 7-10 business days please give their office a call.  [facility] [address] [place] [phone]  Follow-Up: Your next appointment:   Your physician recommends that you schedule a follow-up appointment in: 3 MONTHS for a CPE with Dr. de [place]  You will receive a text message or e-mail with a link to a survey about your care and experience with [place] today! We would greatly appreciate your feedback!   Thanks for letting us be apart of your health journey!!  Primary Care and Sports Medicine   Dr. Ceasar Mons [place]   We encourage you to activate your patient portal called "MyChart".  Sign up information is provided on this After Visit Summary.  MyChart is used to connect with patients for Virtual Visits (Telemedicine).  Patients are able to view lab/test results, encounter notes, upcoming appointments, etc.  Non-urgent messages can be sent to your provider as well. To learn more about what you can do with MyChart, please visit --  [email or web address].

## 2021-03-01 NOTE — Progress Notes (Signed)
° ° °  Procedures performed today:    None.  Independent interpretation of notes and tests performed by another provider:   None.  Brief History, Exam, Impression, and Recommendations:    BP 104/74   Pulse (!) 58   Ht 5' (1.524 m)   Wt 202 lb (91.6 kg)   SpO2 99%   Breastfeeding No   BMI 39.45 kg/m   Alopecia Patient reports that she has had noticeable hair loss which she feels has been worse over the past couple years.  Reports intermittent scalp pain/itching.  Indicates that she has tried various OTC shampoos without significant relief of symptoms.  She also reports a family history of hair loss in her mother.  She is requesting referral to dermatology for further evaluation. On exam, no significant areas of redness or erythema located in the scalp.  No large patches of missing hair observed. We will proceed with referral to dermatology for further evaluation and recommendations  Plan for follow-up in about 3 months for CPE, nurse visit 1 week prior for labs   ___________________________________________ Jex Strausbaugh de [place], MD, ABFM, CAQSM Primary Care and Sports Medicine [facility]

## 2021-05-01 ENCOUNTER — Other Ambulatory Visit: Payer: Self-pay

## 2021-05-01 ENCOUNTER — Encounter (HOSPITAL_COMMUNITY): Payer: Self-pay | Admitting: Emergency Medicine

## 2021-05-01 ENCOUNTER — Ambulatory Visit (HOSPITAL_COMMUNITY)
Admission: EM | Admit: 2021-05-01 | Discharge: 2021-05-01 | Disposition: A | Discharge status: Home / Self Care | Payer: Medicaid Other | Attending: Urgent Care | Admitting: Urgent Care

## 2021-05-01 DIAGNOSIS — R21 Rash and other nonspecific skin eruption: Secondary | ICD-10-CM | POA: Diagnosis not present

## 2021-05-01 MED ORDER — CLOTRIMAZOLE-BETAMETHASONE 1-0.05 % EX CREA: TOPICAL_CREAM | CUTANEOUS | 0 refills | Status: DC

## 2021-05-01 MED ORDER — HYDROXYZINE HCL 25 MG PO TABS: 25.0000 mg | ORAL_TABLET | Freq: Four times a day (QID) | ORAL | 0 refills | Status: DC | PRN

## 2021-05-01 NOTE — Discharge Instructions (Signed)
Please start using the topical cream twice daily for up to 14 days.  Stop when the rash is resolved.  Avoid using on the face, groin, armpits. Use the hydroxyzine as needed to help stop the itch.  This medication might make you slightly drowsy. Follow-up with your PCP in 1 to 2 weeks if it has not resolved.

## 2021-05-01 NOTE — ED Provider Notes (Signed)
[place]    CSN: [id number] Arrival date & time: 05/01/21  1554      History   Chief Complaint No chief complaint on file.   HPI Hannah Morton is a 31 y.o. female.   Presents today with concerns of a rash.  It started 17 days ago, patient states [facility] and was given prednisone for symptoms. She also reports being given a Toradol shot at [facility] accidentally, that was indicated for someone else. She is concerned that her new and worsening rash may be an allergic reaction to the shot. She states that the initial rash on her left medial ankle has improved slightly, but the itching is still intensely pruritic, and it has now spread to other areas.  She states now there is a rash on her inner thighs and to her arms.  She denies any known cause, but again is concerned about the injection at [place]. Pt has tolerated PO NSAIDs in the past.  Apart from the prednisone which she took for 5 days she has not tried any additional treatments.  No past medical history and takes no daily medications.  She has had no travel or change to her lifestyle.    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 Authorizing Provider  clotrimazole-betamethasone (LOTRISONE) cream Apply to affected area 2 times daily for up 14 days maximum 05/01/21  Yes Chaz Ronning L, PA  hydrOXYzine (ATARAX) 25 MG tablet Take 1 tablet (25 mg total) by mouth every 6 (six) hours as needed for itching. 05/01/21  Yes Ediberto Sens L, PA  fluticasone (FLONASE) 50 MCG/ACT nasal spray Place 2 sprays into both nostrils daily.    [provider]  Multiple Vitamins-Minerals (MULTIVITAMIN WITH MINERALS) tablet Take 1 tablet by mouth daily.    [provider]    Family History Family History  Problem Relation Age of Onset   Alcohol abuse Mother    COPD Mother    Hypertension Mother    Diabetes Father    Alcohol abuse Brother    Hypertension Brother    Arthritis Maternal Grandmother    Asthma Maternal Grandmother    Birth defects Maternal Grandmother  Cancer Maternal Grandmother    COPD Maternal Grandmother    Diabetes Maternal Grandmother    Heart disease Maternal Grandmother    Hyperlipidemia Maternal Grandmother    Stroke Maternal Grandfather    Stroke Paternal Grandfather    Heart attack Paternal Grandfather     Social History Social History   Tobacco Use   Smoking status: Never   Smokeless tobacco: Never   Tobacco comments:    Only when drinking  Vaping Use   Vaping Use: Never used  Substance Use Topics   Alcohol use: Yes    Alcohol/week: 2.0 standard drinks    Types: 2 Standard drinks or equivalent per week    Comment: socially   Drug use: No     Allergies   Codeine   Review of Systems Review of Systems  Skin:  Positive for rash.  All other systems reviewed and are negative.   Physical Exam Triage Vital Signs ED Triage Vitals  Enc Vitals Group     BP 05/01/21 1648 138/84      Pulse Rate 05/01/21 1648 79     Resp 05/01/21 1648 18     Temp 05/01/21 1648 98.1 F (36.7 C)     Temp Source 05/01/21 1648 Oral     SpO2 05/01/21 1648 98 %     Weight --      Height --      Head Circumference --      Peak Flow --      Pain Score 05/01/21 1654 0     Pain Loc --      Pain Edu? --      Excl. in [place]? --    No data found.  Updated Vital Signs BP 138/84 (BP Location: Right Arm)    Pulse 79    Temp 98.1 F (36.7 C) (Oral)    Resp 18    LMP 04/27/2021 (Exact Date)    SpO2 98%    Breastfeeding No   Visual Acuity Right Eye Distance:   Left Eye Distance:   Bilateral Distance:    Right Eye Near:   Left Eye Near:    Bilateral Near:     Physical Exam Vitals and nursing note reviewed.  Constitutional:      General: She is not in acute distress.    Appearance: Normal appearance. She is well-developed. She is obese. She is not ill-appearing, toxic-appearing or diaphoretic.  HENT:     Head: Normocephalic and atraumatic.     Mouth/Throat:     Mouth: Mucous membranes are moist.  Eyes:     Extraocular Movements: Extraocular movements intact.     Conjunctiva/sclera: Conjunctivae normal.     Pupils: Pupils are equal, round, and reactive to light.  Cardiovascular:     Rate and Rhythm: Normal rate and regular rhythm.     Heart sounds: No murmur heard. Pulmonary:     Effort: Pulmonary effort is normal. No respiratory distress.     Breath sounds: Normal breath sounds.  Abdominal:     Palpations: Abdomen is soft.     Tenderness: There is no abdominal tenderness.  Musculoskeletal:        General: No swelling.     Cervical back: Normal range of motion and neck supple.  Skin:    General: Skin is warm and dry.     Capillary Refill: Capillary refill takes less than 2 seconds.     Findings: Rash present.     Comments: L medial ankle shows  a well demarcated, raised plaque, with central hyperpigmentation and scab. Several papules surrounding the initial lesion.  Across  her bilateral upper, medial thighs several patches of erythematous papules noted. Scattered papules noted to anterior arms as well. No surrounding erythema. No discharge, scale or flaking.  Neurological:     Mental Status: She is alert.  Psychiatric:        Mood and Affect: Mood normal.     UC Treatments / Results  Labs (all labs ordered are listed, but only abnormal results are displayed) Labs Reviewed - No data to display  EKG   Radiology No results found.  Procedures Procedures (including critical care time)  Medications Ordered in UC Medications - No data to display  Initial Impression / Assessment and Plan / UC Course  I have reviewed the triage vital signs and the nursing notes.  Pertinent labs & imaging results that were available during my care of the patient were reviewed by me and considered in my medical decision making (see chart for details).     Maculopapular rash -I am questioning if the original rash to her left medial ankle is actually tinea incognito, and worsening due to her continued itching in addition to her recent prednisone use.  The papular rash does not look like a standard allergic reaction, I doubt this is any complication from her inadvertent Toradol shot.  We will stop the itching with as needed hydroxyzine.  We will do trial of Lotrisone cream to cover for inflammation and possible tinea.  She understands not to cover it, or use longer than 14 days.  Follow-up with PCP should symptoms persist.  Final Clinical Impressions(s) / UC Diagnoses   Final diagnoses:  Maculopapular rash, localized     Discharge Instructions      Please start using the topical cream twice daily for up to 14 days.  Stop when the rash is resolved.  Avoid using on the face, groin, armpits. Use the hydroxyzine as needed to help stop the itch.  This medication might make you slightly drowsy. Follow-up with your PCP in 1 to 2 weeks if it has not resolved.    ED  Prescriptions     Medication Sig Dispense Auth. Provider   clotrimazole-betamethasone (LOTRISONE) cream Apply to affected area 2 times daily for up 14 days maximum 45 g Sadiq Mccauley L, PA   hydrOXYzine (ATARAX) 25 MG tablet Take 1 tablet (25 mg total) by mouth every 6 (six) hours as needed for itching. 20 tablet Navid Lenzen L, [place]      PDMP not reviewed this encounter.   Chaney Malling, [place] 05/02/21 [phone]

## 2021-05-01 NOTE — ED Triage Notes (Signed)
Patient c/o rash on RT lower leg x 17 days.   Patient was seen at another Urgent Care " I told them I thought it was a bite, they accidentally gave me Toradol.. patient was prescribed prednisone in the end and symptoms have worsened, so I'm not sure what exactly caused all this".   Patient endorses itchiness to site.   Patient denies drainage at site.   Patient endorses worsening rash that has spread to othe places on body.   Patient has taken a 5 day course of Prednisone with no relief of symptoms.   Patient has used Eucerin with no relief of symptoms.

## 2021-05-24 ENCOUNTER — Ambulatory Visit (HOSPITAL_BASED_OUTPATIENT_CLINIC_OR_DEPARTMENT_OTHER): Payer: Medicaid Other | Admitting: Family Medicine

## 2021-05-26 ENCOUNTER — Ambulatory Visit (HOSPITAL_BASED_OUTPATIENT_CLINIC_OR_DEPARTMENT_OTHER): Payer: Medicaid Other

## 2021-05-26 ENCOUNTER — Other Ambulatory Visit: Payer: Self-pay

## 2021-05-26 ENCOUNTER — Other Ambulatory Visit (HOSPITAL_BASED_OUTPATIENT_CLINIC_OR_DEPARTMENT_OTHER): Payer: Self-pay | Admitting: Family Medicine

## 2021-05-26 DIAGNOSIS — Z0000 Encounter for general adult medical examination without abnormal findings: Secondary | ICD-10-CM

## 2021-05-26 DIAGNOSIS — L659 Nonscarring hair loss, unspecified: Secondary | ICD-10-CM

## 2021-05-26 NOTE — Assessment & Plan Note (Signed)
Patient reports that she has had noticeable hair loss which she feels has been worse over the past couple years.  Reports intermittent scalp pain/itching.  Indicates that she has tried various OTC shampoos without significant relief of symptoms.  She also reports a family history of hair loss in her mother.  She is requesting referral to dermatology for further evaluation. ?On exam, no significant areas of redness or erythema located in the scalp.  No large patches of missing hair observed. ?We will proceed with referral to dermatology for further evaluation and recommendations ?

## 2021-05-27 LAB — COMPREHENSIVE METABOLIC PANEL
ALT: 11 IU/L (ref 0–32)
AST: 16 IU/L (ref 0–40)
Albumin/Globulin Ratio: 1.8 (ref 1.2–2.2)
Albumin: 4.7 g/dL (ref 3.8–4.8)
Alkaline Phosphatase: 66 IU/L (ref 44–121)
BUN/Creatinine Ratio: 13 (ref 9–23)
BUN: 9 mg/dL (ref 6–20)
Bilirubin Total: 0.3 mg/dL (ref 0.0–1.2)
CO2: 22 mmol/L (ref 20–29)
Calcium: 9.5 mg/dL (ref 8.7–10.2)
Chloride: 106 mmol/L (ref 96–106)
Creatinine, Ser: 0.72 mg/dL (ref 0.57–1.00)
Globulin, Total: 2.6 g/dL (ref 1.5–4.5)
Glucose: 92 mg/dL (ref 70–99)
Potassium: 4.5 mmol/L (ref 3.5–5.2)
Sodium: 141 mmol/L (ref 134–144)
Total Protein: 7.3 g/dL (ref 6.0–8.5)
eGFR: 114 mL/min/{1.73_m2} (ref >59)

## 2021-05-27 LAB — CBC WITH DIFFERENTIAL/PLATELET
Basophils Absolute: 0 10*3/uL (ref 0.0–0.2)
Basos: 1 %
EOS (ABSOLUTE): 0.3 10*3/uL (ref 0.0–0.4)
Eos: 3 %
Hematocrit: 42.8 % (ref 34.0–46.6)
Hemoglobin: 14.1 g/dL (ref 11.1–15.9)
Immature Grans (Abs): 0 10*3/uL (ref 0.0–0.1)
Immature Granulocytes: 0 %
Lymphocytes Absolute: 2.4 10*3/uL (ref 0.7–3.1)
Lymphs: 30 %
MCH: 28.6 pg (ref 26.6–33.0)
MCHC: 32.9 g/dL (ref 31.5–35.7)
MCV: 87 fL (ref 79–97)
Monocytes Absolute: 0.5 10*3/uL (ref 0.1–0.9)
Monocytes: 6 %
Neutrophils Absolute: 4.9 10*3/uL (ref 1.4–7.0)
Neutrophils: 60 %
Platelets: 151 10*3/uL (ref 150–450)
RBC: 4.93 x10E6/uL (ref 3.77–5.28)
RDW: 13.1 % (ref 11.7–15.4)
WBC: 8.1 10*3/uL (ref 3.4–10.8)

## 2021-05-27 LAB — LIPID PANEL
Chol/HDL Ratio: 3.2 ratio (ref 0.0–4.4)
Cholesterol, Total: 146 mg/dL (ref 100–199)
HDL: 45 mg/dL (ref >39)
LDL Chol Calc (NIH): 89 mg/dL (ref 0–99)
Triglycerides: 58 mg/dL (ref 0–149)
VLDL Cholesterol Cal: 12 mg/dL (ref 5–40)

## 2021-05-27 LAB — TSH RFX ON ABNORMAL TO FREE T4: TSH: 0.974 u[IU]/mL (ref 0.450–4.500)

## 2021-05-27 LAB — HEMOGLOBIN A1C
Est. average glucose Bld gHb Est-mCnc: 97 mg/dL
Hgb A1c MFr Bld: 5 % (ref 4.8–5.6)

## 2021-05-31 ENCOUNTER — Encounter (HOSPITAL_BASED_OUTPATIENT_CLINIC_OR_DEPARTMENT_OTHER): Payer: Self-pay | Admitting: Family Medicine

## 2021-05-31 ENCOUNTER — Ambulatory Visit (INDEPENDENT_AMBULATORY_CARE_PROVIDER_SITE_OTHER): Payer: Medicaid Other | Admitting: Family Medicine

## 2021-05-31 ENCOUNTER — Other Ambulatory Visit: Payer: Self-pay

## 2021-05-31 DIAGNOSIS — Z0000 Encounter for general adult medical examination without abnormal findings: Secondary | ICD-10-CM | POA: Insufficient documentation

## 2021-05-31 NOTE — Patient Instructions (Signed)
?  Medication Instructions:  ?Your physician recommends that you continue on your current medications as directed. Please refer to the Current Medication list given to you today. ?--If you need a refill on any your medications before your next appointment, please call your pharmacy first. If no refills are authorized on file call the office.-- ? ? ? ?Follow-Up: ?Your next appointment:   ?Your physician recommends that you schedule a follow-up appointment in: 1 year sooner if needed with Dr. Tennis Must [place] ? ?You will receive a text message or e-mail with a link to a survey about your care and experience with [place] today! We would greatly appreciate your feedback!  ? ?Thanks for letting us be apart of your health journey!!  ?Primary Care and Sports Medicine  ? ?Dr. Kyung Rudd de [place]  ? ?We encourage you to activate your patient portal called "MyChart".  Sign up information is provided on this After Visit Summary.  MyChart is used to connect with patients for Virtual Visits (Telemedicine).  Patients are able to view lab/test results, encounter notes, upcoming appointments, etc.  Non-urgent messages can be sent to your provider as well. To learn more about what you can do with MyChart, please visit --  [email or web address].   ? ?

## 2021-05-31 NOTE — Assessment & Plan Note (Addendum)
Routine HCM labs reviewed. HCM reviewed/discussed. Anticipatory guidance regarding healthy weight, lifestyle and choices given. ?Recommend healthy diet.  Recommend approximately 150 minutes/week of moderate intensity exercise ?Recommend regular dental and vision exams -is due for the screenings, indicates insurance coverage is primary reason for not being up-to-date ?Always use seatbelt/lap and shoulder restraints ?Recommend using smoke alarms and checking batteries at least twice a year ?Recommend using sunscreen when outside ?She completes cervical cancer screening with OB/GYN, most recent was within the past couple months which she reports was normal ?

## 2021-05-31 NOTE — Progress Notes (Signed)
?Subjective:   ? ?CC: Annual Physical Exam ? ?HPI:  ?Hannah Morton is a 31 y.o. presenting for annual physical ? ?I reviewed the past medical history, family history, social history, surgical history, and allergies today and no changes were needed.  Please see the problem list section below in epic for further details. ? ?Past Medical History: ?Past Medical History:  ?Diagnosis Date  ? Frequent headaches   ? Migraines   ? Obesity (BMI 35.0-39.9 without comorbidity)   ? ?Past Surgical History: ?Past Surgical History:  ?Procedure Laterality Date  ? WISDOM TOOTH EXTRACTION    ? ?Social History: ?Social History  ? ?Socioeconomic History  ? Marital status: Married  ?  Spouse name: Not on file  ? Number of children: 3  ? Years of education: Not on file  ? Highest education level: Not on file  ?Occupational History  ? Occupation: Center coordinator/CNA  ?  Employer: [employer]  ?Tobacco Use  ? Smoking status: Never  ? Smokeless tobacco: Never  ? Tobacco comments:  ?  Only when drinking  ?Vaping Use  ? Vaping Use: Never used  ?Substance and Sexual Activity  ? Alcohol use: Yes  ?  Alcohol/week: 2.0 standard drinks  ?  Types: 2 Standard drinks or equivalent per week  ?  Comment: socially  ? Drug use: No  ? Sexual activity: Yes  ?  Birth control/protection: Spermicide  ?Other Topics Concern  ? Not on file  ?Social History Narrative  ? Patient is a married mother of 2 sons.  ? She works as a Lawyer.    ? She may have 1-2 alcoholic drinks a week, but never smoked.  ? She does try to go walking with her sons on occasion, but does not do routine exercise  ? ?Social Determinants of Health  ? ?Financial Resource Strain: Not on file  ?Food Insecurity: Not on file  ?Transportation Needs: Not on file  ?Physical Activity: Not on file  ?Stress: Not on file  ?Social Connections: Not on file  ? ?Family History: ?Family History  ?Problem Relation Age of Onset  ? Alcohol abuse Mother   ? COPD Mother   ? Hypertension Mother   ?  Diabetes Father   ? Alcohol abuse Brother   ? Hypertension Brother   ? Arthritis Maternal Grandmother   ? Asthma Maternal Grandmother   ? Birth defects Maternal Grandmother   ? Cancer Maternal Grandmother   ? COPD Maternal Grandmother   ? Diabetes Maternal Grandmother   ? Heart disease Maternal Grandmother   ? Hyperlipidemia Maternal Grandmother   ? Stroke Maternal Grandfather   ? Stroke Paternal Grandfather   ? Heart attack Paternal Grandfather   ? ?Allergies: ?Allergies  ?Allergen Reactions  ? Codeine Rash  ? ?Medications: See med rec. ? ?Review of Systems: No headache, visual changes, nausea, vomiting, diarrhea, constipation, dizziness, abdominal pain, skin rash, fevers, chills, night sweats, swollen lymph nodes, weight loss, chest pain, body aches, joint swelling, muscle aches, shortness of breath, mood changes, visual or auditory hallucinations. ? ?Objective:   ? ?BP 122/80   Pulse 83   Ht 5' (1.524 m)   Wt 205 lb 9.6 oz (93.3 kg)   SpO2 99%   BMI 40.15 kg/m?  ? ?General: Well Developed, well nourished, and in no acute distress.  ?Neuro: Alert and oriented x3, extra-ocular muscles intact, sensation grossly intact. Cranial nerves II through XII are intact, motor, sensory, and coordinative functions are all intact. ?HEENT:  Normocephalic, atraumatic, pupils equal round reactive to light, neck supple, no masses, no lymphadenopathy, thyroid nonpalpable. Oropharynx, nasopharynx, external ear canals are unremarkable. ?Skin: Warm and dry, no rashes noted.  ?Cardiac: Regular rate and rhythm, no murmurs rubs or gallops.  ?Respiratory: Clear to auscultation bilaterally. Not using accessory muscles, speaking in full sentences.  ?Abdominal: Soft, nontender, nondistended, positive bowel sounds, no masses, no organomegaly.  ?Musculoskeletal: Shoulder, elbow, wrist, hip, knee, ankle stable, and with full range of motion. ? ?Impression and Recommendations:   ? ?Wellness examination ?Routine HCM labs reviewed. HCM  reviewed/discussed. Anticipatory guidance regarding healthy weight, lifestyle and choices given. ?Recommend healthy diet.  Recommend approximately 150 minutes/week of moderate intensity exercise ?Recommend regular dental and vision exams -is due for the screenings, indicates insurance coverage is primary reason for not being up-to-date ?Always use seatbelt/lap and shoulder restraints ?Recommend using smoke alarms and checking batteries at least twice a year ?Recommend using sunscreen when outside ?She completes cervical cancer screening with OB/GYN, most recent was within the past couple months which she reports was normal ? ?Plan for follow-up in 1 year for CPE or sooner as needed ?Can complete labs one week prior - CBC, CMP, lipid panel, TSH w reflex, A1c ? ? ?___________________________________________ ?Hannah Nishida de [place], MD, ABFM, CAQSM ?Primary Care and Sports Medicine ?[facility] ?

## 2022-05-11 ENCOUNTER — Encounter: Payer: Self-pay | Admitting: Emergency Medicine

## 2022-05-11 ENCOUNTER — Ambulatory Visit (INDEPENDENT_AMBULATORY_CARE_PROVIDER_SITE_OTHER): Payer: Medicaid Other | Admitting: Emergency Medicine

## 2022-05-11 VITALS — BP 138/88 | HR 91 | Temp 98.4°F | Ht 60.0 in | Wt 203.5 lb

## 2022-05-11 DIAGNOSIS — E669 Obesity, unspecified: Secondary | ICD-10-CM

## 2022-05-11 DIAGNOSIS — K5909 Other constipation: Secondary | ICD-10-CM

## 2022-05-11 DIAGNOSIS — L298 Other pruritus: Secondary | ICD-10-CM

## 2022-05-11 DIAGNOSIS — Z7689 Persons encountering health services in other specified circumstances: Secondary | ICD-10-CM

## 2022-05-11 LAB — CBC WITH DIFFERENTIAL/PLATELET
Basophils Absolute: 0.1 10*3/uL (ref 0.0–0.1)
Basophils Relative: 0.9 % (ref 0.0–3.0)
Eosinophils Absolute: 0.2 10*3/uL (ref 0.0–0.7)
Eosinophils Relative: 1.9 % (ref 0.0–5.0)
HCT: 39.8 % (ref 36.0–46.0)
Hemoglobin: 13.2 g/dL (ref 12.0–15.0)
Lymphocytes Relative: 30.3 % (ref 12.0–46.0)
Lymphs Abs: 3.2 10*3/uL (ref 0.7–4.0)
MCHC: 33.3 g/dL (ref 30.0–36.0)
MCV: 83.5 fl (ref 78.0–100.0)
Monocytes Absolute: 0.7 10*3/uL (ref 0.1–1.0)
Monocytes Relative: 6.5 % (ref 3.0–12.0)
Neutro Abs: 6.3 10*3/uL (ref 1.4–7.7)
Neutrophils Relative %: 60.4 % (ref 43.0–77.0)
Platelets: 274 10*3/uL (ref 150.0–400.0)
RBC: 4.77 Mil/uL (ref 3.87–5.11)
RDW: 13.7 % (ref 11.5–15.5)
WBC: 10.5 10*3/uL (ref 4.0–10.5)

## 2022-05-11 LAB — COMPREHENSIVE METABOLIC PANEL
ALT: 10 U/L (ref 0–35)
AST: 14 U/L (ref 0–37)
Albumin: 4.2 g/dL (ref 3.5–5.2)
Alkaline Phosphatase: 56 U/L (ref 39–117)
BUN: 11 mg/dL (ref 6–23)
CO2: 27 mEq/L (ref 19–32)
Calcium: 9.7 mg/dL (ref 8.4–10.5)
Chloride: 102 mEq/L (ref 96–112)
Creatinine, Ser: 0.68 mg/dL (ref 0.40–1.20)
GFR: 114.5 mL/min (ref >60.00)
Glucose, Bld: 81 mg/dL (ref 70–99)
Potassium: 3.7 mEq/L (ref 3.5–5.1)
Sodium: 136 mEq/L (ref 135–145)
Total Bilirubin: 0.4 mg/dL (ref 0.2–1.2)
Total Protein: 7.8 g/dL (ref 6.0–8.3)

## 2022-05-11 LAB — LIPID PANEL
Cholesterol: 143 mg/dL (ref 0–200)
HDL: 50.7 mg/dL (ref >39.00)
LDL Cholesterol: 80 mg/dL (ref 0–99)
NonHDL: 92.53
Total CHOL/HDL Ratio: 3
Triglycerides: 64 mg/dL (ref 0.0–149.0)
VLDL: 12.8 mg/dL (ref 0.0–40.0)

## 2022-05-11 LAB — TSH: TSH: 1.26 u[IU]/mL (ref 0.35–5.50)

## 2022-05-11 LAB — HEMOGLOBIN A1C: Hgb A1c MFr Bld: 5.4 % (ref 4.6–6.5)

## 2022-05-11 NOTE — Assessment & Plan Note (Addendum)
Diet and nutrition discussed. Advised to decrease amount of daily carbohydrate intake and daily calories and increase amount of plant based protein in her diet Benefits of exercise discussed.

## 2022-05-11 NOTE — Progress Notes (Addendum)
Hannah Morton 32 y.o.   Chief Complaint  Patient presents with   New Patient (Initial Visit)    Patient is having some issues with her skin, rash, itching, burning sensation,   Abd issue, chronic constipation  severe abd pain    HISTORY OF PRESENT ILLNESS: This is a 32 y.o. female first visit to this office, here to establish care with me. Complaining of chronic pruritic rash for several months Also has history of chronic constipation for couple years. No history of chronic medical conditions.  No chronic medications.  HPI   Prior to Admission medications   Medication Sig Start Date End Date Taking? Authorizing Provider  Multiple Vitamins-Minerals (MULTIVITAMIN WITH MINERALS) tablet Take 1 tablet by mouth daily.   Yes [provider]  clotrimazole-betamethasone (LOTRISONE) cream Apply to affected area 2 times daily for up 14 days maximum Patient not taking: Reported on 05/11/2022 05/01/21   Geryl Councilman L, PA  hydrOXYzine (ATARAX) 25 MG tablet Take 1 tablet (25 mg total) by mouth every 6 (six) hours as needed for itching. Patient not taking: Reported on 05/11/2022 05/01/21   Geryl Councilman L, PA    Allergies  Allergen Reactions   Codeine Rash    Patient Active Problem List   Diagnosis Date Noted   BMI 39.0-39.9,adult 06/08/2020    Past Medical History:  Diagnosis Date   Frequent headaches    Migraines    Obesity (BMI 35.0-39.9 without comorbidity)     Past Surgical History:  Procedure Laterality Date   WISDOM TOOTH EXTRACTION      Social History   Socioeconomic History   Marital status: Married    Spouse name: Not on file   Number of children: 3   Years of education: Not on file   Highest education level: Not on file  Occupational History   Occupation: Center coordinator/CNA    Employer: [employer]  Tobacco Use   Smoking status: Never   Smokeless tobacco: Never   Tobacco comments:    Only when drinking  Vaping Use   Vaping  Use: Never used  Substance and Sexual Activity   Alcohol use: Yes    Alcohol/week: 2.0 standard drinks of alcohol    Types: 2 Standard drinks or equivalent per week    Comment: socially   Drug use: No   Sexual activity: Yes    Birth control/protection: Spermicide  Other Topics Concern   Not on file  Social History Narrative   Patient is a married mother of 2 sons.   She works as a Quarry manager.     She may have 1-2 alcoholic drinks a week, but never smoked.   She does try to go walking with her sons on occasion, but does not do routine exercise   Social Determinants of Health   Financial Resource Strain: Not on file  Food Insecurity: Not on file  Transportation Needs: Not on file  Physical Activity: Not on file  Stress: Not on file  Social Connections: Not on file  Intimate Partner Violence: Not on file    Family History  Problem Relation Age of Onset   Alcohol abuse Mother    COPD Mother    Hypertension Mother    Diabetes Father    Alcohol abuse Brother    Hypertension Brother    Arthritis Maternal Grandmother    Asthma Maternal Grandmother    Birth defects Maternal Grandmother    Cancer Maternal Grandmother    COPD Maternal Grandmother  Diabetes Maternal Grandmother    Heart disease Maternal Grandmother    Hyperlipidemia Maternal Grandmother    Stroke Maternal Grandfather    Stroke Paternal Grandfather    Heart attack Paternal Grandfather      Review of Systems  Constitutional: Negative.  Negative for chills and fever.  HENT: Negative.  Negative for congestion and sore throat.   Respiratory: Negative.  Negative for cough and shortness of breath.   Cardiovascular: Negative.  Negative for chest pain and palpitations.  Gastrointestinal:  Positive for constipation. Negative for abdominal pain, diarrhea, nausea and vomiting.  Genitourinary: Negative.  Negative for dysuria and hematuria.  Skin:  Positive for rash.  Neurological: Negative.  Negative for dizziness and  headaches.  All other systems reviewed and are negative.  Today's Vitals   05/11/22 1543  BP: 138/88  Pulse: 91  Temp: 98.4 F (36.9 C)  TempSrc: Oral  SpO2: 98%  Weight: 203 lb 8 oz (92.3 kg)  Height: 5' (1.524 m)   Body mass index is 39.74 kg/m.   Physical Exam Constitutional:      Appearance: Normal appearance. She is obese.  HENT:     Head: Normocephalic.     Mouth/Throat:     Mouth: Mucous membranes are moist.     Pharynx: Oropharynx is clear.  Eyes:     Extraocular Movements: Extraocular movements intact.     Pupils: Pupils are equal, round, and reactive to light.  Cardiovascular:     Rate and Rhythm: Normal rate and regular rhythm.     Pulses: Normal pulses.     Heart sounds: Normal heart sounds.  Pulmonary:     Effort: Pulmonary effort is normal.     Breath sounds: Normal breath sounds.  Musculoskeletal:     Cervical back: No tenderness.  Lymphadenopathy:     Cervical: No cervical adenopathy.  Skin:    General: Skin is warm and dry.  Neurological:     General: No focal deficit present.     Mental Status: She is alert and oriented to person, place, and time.  Psychiatric:        Mood and Affect: Mood normal.        Behavior: Behavior normal.      ASSESSMENT & PLAN: A total of 47 minutes was spent with the patient and counseling/coordination of care regarding preparing for this visit, review of available medical records, comprehensive history and physical examination, management of chronic constipation, review of most recent blood work results and need for blood work today, education on nutrition, need for dermatology evaluation, prognosis, documentation, and need for follow-up.  Problem List Items Addressed This Visit       Digestive   Chronic constipation    Advised to increase amount of fiber in her diet Advised to take daily fiber supplements and stay well-hydrated Diet and nutrition discussed May take milk of magnesia or MiraLAX as  needed Suppositories as needed Benefits of probiotics discussed      Relevant Orders   TSH   Comprehensive metabolic panel     Musculoskeletal and Integument   Chronic pruritic rash in adult - Primary    Intermittent.  No red flag signs or symptoms. Patient has a dermatologist.  Advised to follow-up with her.      Relevant Orders   CBC with Differential/Platelet     Other   Obesity (BMI 35.0-39.9 without comorbidity)    Diet and nutrition discussed. Advised to decrease amount of daily carbohydrate intake and daily  calories and increase amount of plant based protein in her diet Benefits of exercise discussed.      Relevant Orders   CBC with Differential/Platelet   TSH   Comprehensive metabolic panel   Lipid panel   Hemoglobin A1c   Other Visit Diagnoses     Encounter to establish care          Patient Instructions  Health Maintenance, Female Adopting a healthy lifestyle and getting preventive care are important in promoting health and wellness. Ask your health care provider about: The right schedule for you to have regular tests and exams. Things you can do on your own to prevent diseases and keep yourself healthy. What should I know about diet, weight, and exercise? Eat a healthy diet  Eat a diet that includes plenty of vegetables, fruits, low-fat dairy products, and lean protein. Do not eat a lot of foods that are high in solid fats, added sugars, or sodium. Maintain a healthy weight Body mass index (BMI) is used to identify weight problems. It estimates body fat based on height and weight. Your health care provider can help determine your BMI and help you achieve or maintain a healthy weight. Get regular exercise Get regular exercise. This is one of the most important things you can do for your health. Most adults should: Exercise for at least 150 minutes each week. The exercise should increase your heart rate and make you sweat (moderate-intensity exercise). Do  strengthening exercises at least twice a week. This is in addition to the moderate-intensity exercise. Spend less time sitting. Even light physical activity can be beneficial. Watch cholesterol and blood lipids Have your blood tested for lipids and cholesterol at 34 years of age, then have this test every 5 years. Have your cholesterol levels checked more often if: Your lipid or cholesterol levels are high. You are older than 34 years of age. You are at high risk for heart disease. What should I know about cancer screening? Depending on your health history and family history, you may need to have cancer screening at various ages. This may include screening for: Breast cancer. Cervical cancer. Colorectal cancer. Skin cancer. Lung cancer. What should I know about heart disease, diabetes, and high blood pressure? Blood pressure and heart disease High blood pressure causes heart disease and increases the risk of stroke. This is more likely to develop in people who have high blood pressure readings or are overweight. Have your blood pressure checked: Every 3-5 years if you are 28-63 years of age. Every year if you are 37 years old or older. Diabetes Have regular diabetes screenings. This checks your fasting blood sugar level. Have the screening done: Once every three years after age 62 if you are at a normal weight and have a low risk for diabetes. More often and at a younger age if you are overweight or have a high risk for diabetes. What should I know about preventing infection? Hepatitis B If you have a higher risk for hepatitis B, you should be screened for this virus. Talk with your health care provider to find out if you are at risk for hepatitis B infection. Hepatitis C Testing is recommended for: Everyone born from [birth year] through 1967. Anyone with known risk factors for hepatitis C. Sexually transmitted infections (STIs) Get screened for STIs, including gonorrhea and chlamydia,  if: You are sexually active and are younger than 34 years of age. You are older than 34 years of age and your health care provider  tells you that you are at risk for this type of infection. Your sexual activity has changed since you were last screened, and you are at increased risk for chlamydia or gonorrhea. Ask your health care provider if you are at risk. Ask your health care provider about whether you are at high risk for HIV. Your health care provider may recommend a prescription medicine to help prevent HIV infection. If you choose to take medicine to prevent HIV, you should first get tested for HIV. You should then be tested every 3 months for as long as you are taking the medicine. Pregnancy If you are about to stop having your period (premenopausal) and you may become pregnant, seek counseling before you get pregnant. Take 400 to 800 micrograms (mcg) of folic acid every day if you become pregnant. Ask for birth control (contraception) if you want to prevent pregnancy. Osteoporosis and menopause Osteoporosis is a disease in which the bones lose minerals and strength with aging. This can result in bone fractures. If you are 87 years old or older, or if you are at risk for osteoporosis and fractures, ask your health care provider if you should: Be screened for bone loss. Take a calcium or vitamin D supplement to lower your risk of fractures. Be given hormone replacement therapy (HRT) to treat symptoms of menopause. Follow these instructions at home: Alcohol use Do not drink alcohol if: Your health care provider tells you not to drink. You are pregnant, may be pregnant, or are planning to become pregnant. If you drink alcohol: Limit how much you have to: 0-1 drink a day. Know how much alcohol is in your drink. In the [place] one drink equals one 12 oz bottle of beer (355 mL), one 5 oz glass of wine (148 mL), or one 1 oz glass of hard liquor (44 mL). Lifestyle Do not use any products that  contain nicotine or tobacco. These products include cigarettes, chewing tobacco, and vaping devices, such as e-cigarettes. If you need help quitting, ask your health care provider. Do not use street drugs. Do not share needles. Ask your health care provider for help if you need support or information about quitting drugs. General instructions Schedule regular health, dental, and eye exams. Stay current with your vaccines. Tell your health care provider if: You often feel depressed. You have ever been abused or do not feel safe at home. Summary Adopting a healthy lifestyle and getting preventive care are important in promoting health and wellness. Follow your health care provider's instructions about healthy diet, exercising, and getting tested or screened for diseases. Follow your health care provider's instructions on monitoring your cholesterol and blood pressure. This information is not intended to replace advice given to you by your health care provider. Make sure you discuss any questions you have with your health care provider. Document Revised: 07/20/2020 Document Reviewed: 07/20/2020 Elsevier Patient Education  [place], MD [facility]

## 2022-05-11 NOTE — Assessment & Plan Note (Signed)
Intermittent.  No red flag signs or symptoms. Patient has a dermatologist.  Advised to follow-up with her.

## 2022-05-11 NOTE — Patient Instructions (Signed)

## 2022-05-11 NOTE — Assessment & Plan Note (Signed)
Advised to increase amount of fiber in her diet Advised to take daily fiber supplements and stay well-hydrated Diet and nutrition discussed May take milk of magnesia or MiraLAX as needed Suppositories as needed Benefits of probiotics discussed

## 2022-05-13 ENCOUNTER — Encounter: Payer: Self-pay | Admitting: Emergency Medicine

## 2022-05-14 NOTE — Telephone Encounter (Signed)
Monitor the symptoms and follow-up as needed.  Do not suspect an autoimmune condition.  Recommend follow-up in 3 to 6 months as patient's wishes.  Earlier as needed. Thanks.

## 2022-11-13 ENCOUNTER — Emergency Department (HOSPITAL_BASED_OUTPATIENT_CLINIC_OR_DEPARTMENT_OTHER): Payer: Medicaid Other

## 2022-11-13 ENCOUNTER — Other Ambulatory Visit: Payer: Self-pay

## 2022-11-13 ENCOUNTER — Emergency Department (HOSPITAL_BASED_OUTPATIENT_CLINIC_OR_DEPARTMENT_OTHER)
Admission: EM | Admit: 2022-11-13 | Discharge: 2022-11-13 | Disposition: A | Discharge status: Home / Self Care | Payer: Medicaid Other | Source: Home / Self Care | Attending: Emergency Medicine | Admitting: Emergency Medicine

## 2022-11-13 DIAGNOSIS — R1012 Left upper quadrant pain: Secondary | ICD-10-CM | POA: Diagnosis present

## 2022-11-13 DIAGNOSIS — R1084 Generalized abdominal pain: Secondary | ICD-10-CM | POA: Insufficient documentation

## 2022-11-13 LAB — COMPREHENSIVE METABOLIC PANEL WITH GFR
ALT: 11 U/L (ref 0–44)
AST: 12 U/L — ABNORMAL LOW (ref 15–41)
Albumin: 4.2 g/dL (ref 3.5–5.0)
Alkaline Phosphatase: 42 U/L (ref 38–126)
Anion gap: 10 (ref 5–15)
BUN: 12 mg/dL (ref 6–20)
CO2: 24 mmol/L (ref 22–32)
Calcium: 8.8 mg/dL — ABNORMAL LOW (ref 8.9–10.3)
Chloride: 104 mmol/L (ref 98–111)
Creatinine, Ser: 0.73 mg/dL (ref 0.44–1.00)
GFR, Estimated: >60 mL/min
Glucose, Bld: 109 mg/dL — ABNORMAL HIGH (ref 70–99)
Potassium: 3.3 mmol/L — ABNORMAL LOW (ref 3.5–5.1)
Sodium: 138 mmol/L (ref 135–145)
Total Bilirubin: 0.4 mg/dL (ref 0.3–1.2)
Total Protein: 7.5 g/dL (ref 6.5–8.1)

## 2022-11-13 LAB — URINALYSIS, ROUTINE W REFLEX MICROSCOPIC
Bilirubin Urine: NEGATIVE
Glucose, UA: NEGATIVE mg/dL
Hgb urine dipstick: NEGATIVE
Ketones, ur: NEGATIVE mg/dL
Leukocytes,Ua: NEGATIVE
Nitrite: NEGATIVE
Protein, ur: NEGATIVE mg/dL
Specific Gravity, Urine: 1.02 (ref 1.005–1.030)
pH: 7.5 (ref 5.0–8.0)

## 2022-11-13 LAB — CBC
HCT: 37.8 % (ref 36.0–46.0)
Hemoglobin: 12.9 g/dL (ref 12.0–15.0)
MCH: 28.8 pg (ref 26.0–34.0)
MCHC: 34.1 g/dL (ref 30.0–36.0)
MCV: 84.4 fL (ref 80.0–100.0)
Platelets: 277 10*3/uL (ref 150–400)
RBC: 4.48 MIL/uL (ref 3.87–5.11)
RDW: 14 % (ref 11.5–15.5)
WBC: 13.2 10*3/uL — ABNORMAL HIGH (ref 4.0–10.5)
nRBC: 0 % (ref 0.0–0.2)

## 2022-11-13 LAB — PREGNANCY, URINE: Preg Test, Ur: NEGATIVE

## 2022-11-13 LAB — LIPASE, BLOOD: Lipase: 38 U/L (ref 11–51)

## 2022-11-13 MED ORDER — IOHEXOL 300 MG/ML  SOLN
100.0000 mL | Freq: Once | INTRAMUSCULAR | Status: AC | PRN
  Administered 2022-11-13: 100 mL via INTRAVENOUS

## 2022-11-13 NOTE — ED Provider Notes (Signed)
[facility] EMERGENCY DEPARTMENT AT [facility] Provider Note   CSN: [id number] Arrival date & time: 11/13/22  1849     History  Chief Complaint  Patient presents with   Abdominal Pain    Hannah Morton is a 32 y.o. female.  Patient here with abdominal pain.  Pain mostly over the left side of her abdomen around her incision site from her gallbladder surgery a year ago.  Denies any obvious hernia.  Denies any vaginal bleeding or discharge.  No nausea or vomiting.  She does have history of constipation.  Nothing makes it worse or better.  Denies any fever or chills.  Denies any chest pain shortness of breath.  The history is provided by the patient.       Home Medications Prior to Admission medications   Medication Sig Start Date End Date Taking? Authorizing Provider  clotrimazole-betamethasone (LOTRISONE) cream Apply to affected area 2 times daily for up 14 days maximum Patient not taking: Reported on 05/11/2022 05/01/21   Guy Sandifer L, PA  hydrOXYzine (ATARAX) 25 MG tablet Take 1 tablet (25 mg total) by mouth every 6 (six) hours as needed for itching. Patient not taking: Reported on 05/11/2022 05/01/21   Guy Sandifer L, PA  Multiple Vitamins-Minerals (MULTIVITAMIN WITH MINERALS) tablet Take 1 tablet by mouth daily.    [provider]      Allergies    Codeine    Review of Systems   Review of Systems  Physical Exam Updated Vital Signs BP 122/83   Pulse 85   Temp 98.6 F (37 C)   Resp 18   Ht 5' (1.524 m)   Wt 93 kg   LMP 10/19/2022   SpO2 100%   BMI 40.04 kg/m  Physical Exam Vitals and nursing note reviewed.  Constitutional:      General: She is not in acute distress.    Appearance: She is well-developed. She is not ill-appearing.  HENT:     Head: Normocephalic and atraumatic.     Mouth/Throat:     Mouth: Mucous membranes are moist.  Eyes:     Extraocular Movements: Extraocular movements intact.     Conjunctiva/sclera: Conjunctivae  normal.     Pupils: Pupils are equal, round, and reactive to light.  Cardiovascular:     Rate and Rhythm: Normal rate and regular rhythm.     Heart sounds: Normal heart sounds. No murmur heard. Pulmonary:     Effort: Pulmonary effort is normal. No respiratory distress.     Breath sounds: Normal breath sounds.  Abdominal:     General: Abdomen is flat.     Palpations: Abdomen is soft.     Tenderness: There is abdominal tenderness in the left upper quadrant and left lower quadrant.     Hernia: No hernia is present. There is no hernia in the umbilical area or ventral area.  Musculoskeletal:        General: No swelling.     Cervical back: Neck supple.  Skin:    General: Skin is warm and dry.     Capillary Refill: Capillary refill takes less than 2 seconds.  Neurological:     Mental Status: She is alert.  Psychiatric:        Mood and Affect: Mood normal.     ED Results / Procedures / Treatments   Labs (all labs ordered are listed, but only abnormal results are displayed) Labs Reviewed  COMPREHENSIVE METABOLIC PANEL - Abnormal; Notable for the following components:  Result Value   Potassium 3.3 (*)    Glucose, Bld 109 (*)    Calcium 8.8 (*)    AST 12 (*)    All other components within normal limits  CBC - Abnormal; Notable for the following components:   WBC 13.2 (*)    All other components within normal limits  LIPASE, BLOOD  URINALYSIS, ROUTINE W REFLEX MICROSCOPIC  PREGNANCY, URINE    EKG None  Radiology CT ABDOMEN PELVIS W CONTRAST  Result Date: 11/13/2022 CLINICAL DATA:  Left lower quadrant abdominal pain for 2 weeks. EXAM: CT ABDOMEN AND PELVIS WITH CONTRAST TECHNIQUE: Multidetector CT imaging of the abdomen and pelvis was performed using the standard protocol following bolus administration of intravenous contrast. RADIATION DOSE REDUCTION: This exam was performed according to the departmental dose-optimization program which includes automated exposure control,  adjustment of the mA and/or kV according to patient size and/or use of iterative reconstruction technique. CONTRAST:  OMNIPAQUE IOHEXOL 300 MG/ML  SOLN COMPARISON:  Pelvic ultrasound dated 12/27/2020 and CT abdomen and pelvis dated 09/30/2019. FINDINGS: Lower chest: No acute abnormality. Hepatobiliary: No focal liver abnormality is seen. Status post cholecystectomy. No biliary dilatation. Pancreas: Unremarkable. No pancreatic ductal dilatation or surrounding inflammatory changes. Spleen: Normal in size without focal abnormality. Adrenals/Urinary Tract: Adrenal glands are unremarkable. Kidneys are normal, without renal calculi, focal lesion, or hydronephrosis. Bladder is unremarkable. Stomach/Bowel: Stomach is within normal limits. The appendix is not definitely identified, however no pericecal inflammatory changes are noted to suggest acute appendicitis. No evidence of bowel wall thickening, distention, or inflammatory changes. Vascular/Lymphatic: No significant vascular findings are present. No enlarged abdominal or pelvic lymph nodes. Reproductive: Uterus and bilateral adnexa are unremarkable. Other: No abdominal wall hernia or abnormality. No abdominopelvic ascites. Musculoskeletal: No acute or significant osseous findings. IMPRESSION: No acute findings in the abdomen or pelvis. Electronically Signed   By: Romona Curls M.D.   On: 11/13/2022 21:01    Procedures Procedures    Medications Ordered in ED Medications  iohexol (OMNIPAQUE) 300 MG/ML solution 100 mL (100 mLs Intravenous Contrast Given 11/13/22 2044)    ED Course/ Medical Decision Making/ A&P                                 Medical Decision Making Amount and/or Complexity of Data Reviewed Labs: ordered. Radiology: ordered.  Risk Prescription drug management.   Hannah Morton is here with abdominal pain.  History of cholecystectomy.  No significant medical history otherwise.  Differential diagnosis likely scar tissue pain  versus less likely hernia, bowel obstruction, diverticulitis, could be constipation.  Have no concern for ovarian pathology but will see if there is any large ovarian cyst.  Does not have any vaginal bleeding or discharge.  Could be gas pain.  Will get CBC, CMP, lipase, CT scan abdomen pelvis.  Will check urinalysis and urine pregnancy test.  Patient concern for cancerous process.  Per my review and interpretation labs no significant anemia leukocytosis or electrolyte abnormality or kidney injury.  Pregnancy test negative.  Urinalysis is negative CT scan per radiology reports unremarkable.  No hernia, no bowel obstruction.  Overall I suspect scar tissue pain.  Recommend Tylenol and ibuprofen and rest.  Follow-up with primary care doctor.  Discharged in good condition.  Understands return precautions.  This chart was dictated using voice recognition software.  Despite best efforts to proofread,  errors can occur which can change the  documentation meaning.         Final Clinical Impression(s) / ED Diagnoses Final diagnoses:  Generalized abdominal pain    Rx / DC Orders ED Discharge Orders     None         Virgina Norfolk, DO 11/13/22 2112

## 2022-11-13 NOTE — ED Triage Notes (Signed)
Pt to ED c/o Left lower abdominal pain over the past 2 weeks progressively getting worse. Pt also reports gallbladder removal 1 year ago, and now feels palpable area  at incision site, noticed 2 days ago, reports tender to touch.

## 2022-11-13 NOTE — Discharge Instructions (Signed)
Overall I think your pain is from scar tissue from her surgery.  Please follow-up with your primary care doctor if pain is persistent.  Return if symptoms worsen.

## 2022-11-15 ENCOUNTER — Telehealth: Payer: Self-pay

## 2022-11-15 NOTE — Transitions of Care (Post Inpatient/ED Visit) (Signed)
   11/15/2022  Name: Hannah Morton MRN: [id number] DOB: 1990/11/04  Today's TOC FU Call Status: Today's TOC FU Call Status:: Successful TOC FU Call Completed TOC FU Call Complete Date: 11/15/22 Patient's Name and Date of Birth confirmed.  Transition Care Management Follow-up Telephone Call Date of Discharge: 11/13/22 Discharge Facility: [facility] ([facility] Type of Discharge: Emergency Department Reason for ED Visit: Other: (abdominal pain) How have you been since you were released from the hospital?: Better Any questions or concerns?: No  Items Reviewed: Did you receive and understand the discharge instructions provided?: Yes Medications obtained,verified, and reconciled?: Yes (Medications Reviewed) Any new allergies since your discharge?: No Dietary orders reviewed?: NA Do you have support at home?: Yes  Medications Reviewed Today: Medications Reviewed Today     Reviewed by Barb Merino, LPN (Licensed Practical Nurse) on 11/15/22 at 1405  Med List Status: <None>   Medication Order Taking? Sig Documenting Provider Last Dose Status Informant  clotrimazole-betamethasone (LOTRISONE) cream [id number] No Apply to affected area 2 times daily for up 14 days maximum  Patient not taking: Reported on 05/11/2022   Maretta Bees, PA Not Taking Active   hydrOXYzine (ATARAX) 25 MG tablet [id number] No Take 1 tablet (25 mg total) by mouth every 6 (six) hours as needed for itching.  Patient not taking: Reported on 05/11/2022   Maretta Bees, [place] Not Taking Active   Multiple Vitamins-Minerals (MULTIVITAMIN WITH MINERALS) tablet [id number] No Take 1 tablet by mouth daily. [provider] Taking Active Self            Home Care and Equipment/Supplies: Were Home Health Services Ordered?: NA Any new equipment or medical supplies ordered?: NA  Functional Questionnaire: Do you need assistance with bathing/showering or dressing?: No Do you need assistance with meal  preparation?: No Do you need assistance with eating?: No Do you have difficulty maintaining continence: No Do you need assistance with getting out of bed/getting out of a chair/moving?: No Do you have difficulty managing or taking your medications?: No  Follow up appointments reviewed: PCP Follow-up appointment confirmed?: NA (patient declined appointment at this time) Specialist Hospital Follow-up appointment confirmed?: NA Do you need transportation to your follow-up appointment?: No Do you understand care options if your condition(s) worsen?: Yes-patient verbalized understanding    SIGNATURE Elisha Ponder LPN [facility] AWV Team Direct dial:  [phone]

## 2022-11-16 ENCOUNTER — Ambulatory Visit: Payer: Medicaid Other | Admitting: Emergency Medicine

## 2023-01-20 ENCOUNTER — Other Ambulatory Visit: Payer: Self-pay | Admitting: Medical Genetics

## 2023-01-20 DIAGNOSIS — Z006 Encounter for examination for normal comparison and control in clinical research program: Secondary | ICD-10-CM

## 2023-01-28 ENCOUNTER — Emergency Department (HOSPITAL_BASED_OUTPATIENT_CLINIC_OR_DEPARTMENT_OTHER): Payer: Medicaid Other | Admitting: Radiology

## 2023-01-28 ENCOUNTER — Emergency Department (HOSPITAL_BASED_OUTPATIENT_CLINIC_OR_DEPARTMENT_OTHER)
Admission: EM | Admit: 2023-01-28 | Discharge: 2023-01-28 | Disposition: A | Discharge status: Home / Self Care | Payer: Medicaid Other | Attending: Emergency Medicine | Admitting: Emergency Medicine

## 2023-01-28 ENCOUNTER — Encounter (HOSPITAL_BASED_OUTPATIENT_CLINIC_OR_DEPARTMENT_OTHER): Payer: Self-pay

## 2023-01-28 DIAGNOSIS — M25561 Pain in right knee: Secondary | ICD-10-CM | POA: Diagnosis present

## 2023-01-28 NOTE — ED Notes (Signed)
X-ray at bedside

## 2023-01-28 NOTE — ED Provider Notes (Signed)
[place] EMERGENCY DEPARTMENT AT [facility] Provider Note   CSN: [id number] Arrival date & time: 01/28/23  0865     History  Chief Complaint  Patient presents with   Knee Pain    Hannah Morton is a 32 y.o. female.  Presenting to the ED for evaluation of right knee pain.  Symptoms began approximately 1 week ago.  She denies any recent trauma or falls.  No history of surgery on the knee.  It is localized mostly to the anterior of the knee.  She does occasionally have a shooting pain down the lateral aspect of her calf.  No posterior calf pain, swelling or erythema.  No ankle or hip pain on the right.  She has not taken anything for the pain prior to arrival.  Currently rates the pain at a 5 out of 10.  States it makes it difficult for her to perform her daily tasks.   Knee Pain      Home Medications Prior to Admission medications   Medication Sig Start Date End Date Taking? Authorizing Provider  clotrimazole-betamethasone (LOTRISONE) cream Apply to affected area 2 times daily for up 14 days maximum Patient not taking: Reported on 05/11/2022 05/01/21   Guy Sandifer L, PA  hydrOXYzine (ATARAX) 25 MG tablet Take 1 tablet (25 mg total) by mouth every 6 (six) hours as needed for itching. Patient not taking: Reported on 05/11/2022 05/01/21   Guy Sandifer L, PA  Multiple Vitamins-Minerals (MULTIVITAMIN WITH MINERALS) tablet Take 1 tablet by mouth daily.    [provider]      Allergies    Codeine    Review of Systems   Review of Systems  Musculoskeletal:  Positive for arthralgias.  All other systems reviewed and are negative.   Physical Exam Updated Vital Signs BP (!) 132/96   Pulse 81   Temp 98 F (36.7 C) (Oral)   Resp 16   LMP 01/18/2023 (Exact Date)   SpO2 100%  Physical Exam Vitals and nursing note reviewed.  Constitutional:      General: She is not in acute distress.    Appearance: Normal appearance. She is obese. She is not  ill-appearing.  HENT:     Head: Normocephalic and atraumatic.  Pulmonary:     Effort: Pulmonary effort is normal. No respiratory distress.  Abdominal:     General: Abdomen is flat.  Musculoskeletal:        General: Normal range of motion.     Cervical back: Neck supple.     Comments: Negative anterior and posterior drawer test.  Negative varus and valgus stress test.  No significant swelling of the joint when compared contralaterally.  Negative Homans test.  No lower extremity swelling or erythema.  Skin:    General: Skin is warm and dry.  Neurological:     Mental Status: She is alert and oriented to person, place, and time.  Psychiatric:        Mood and Affect: Mood normal.        Behavior: Behavior normal.     ED Results / Procedures / Treatments   Labs (all labs ordered are listed, but only abnormal results are displayed) Labs Reviewed - No data to display  EKG None  Radiology DG Knee Complete 4 Views Right  Result Date: 01/28/2023 CLINICAL DATA:  Anterior right knee pain EXAM: RIGHT KNEE - COMPLETE 4+ VIEW COMPARISON:  08/14/2015 FINDINGS: No evidence of fracture or dislocation. Small knee joint effusion. No evidence  of arthropathy or other focal bone abnormality. Soft tissues are unremarkable. IMPRESSION: Small knee joint effusion. No acute fracture or dislocation. Electronically Signed   By: Duanne Guess D.O.   On: 01/28/2023 10:03    Procedures Procedures    Medications Ordered in ED Medications - No data to display  ED Course/ Medical Decision Making/ A&P                                 Medical Decision Making Amount and/or Complexity of Data Reviewed Radiology: ordered.  This patient presents to the ED for concern of right knee pain, this involves an extensive number of treatment options, and is a complaint that carries with it a high risk of complications and morbidity.  The differential diagnosis includes fracture, strain, sprain, OA, RA, septic  arthritis  My initial workup includes imaging.  Patient declines pain control  Additional history obtained from: Nursing notes from this visit.  I ordered imaging studies including x-ray right knee I independently visualized and interpreted imaging which showed small effusion, otherwise negative I agree with the radiologist interpretation  Afebrile, hemodynamically stable.  32 year old female presenting for evaluation of atraumatic right knee pain.  Symptoms began approximately 1 week ago.  On exam, patient is obese.  No joint space laxity.  Mild tenderness to palpation of the patella.  No overlying erythema or lesions.  No history of trauma.  No calf pain or lower extremity erythema.  Lower suspicion for DVT given the anterior nature of her pain.  Lower suspicion for septic arthritis given lack of penetrating injury or systemic symptoms or warmth or erythema.  Suspect knee sprain.  She was given an Ace wrap and crutches.  She was encouraged to follow-up with orthopedics and given contact information.  She was given return precautions.  Stable at discharge.  At this time there does not appear to be any evidence of an acute emergency medical condition and the patient appears stable for discharge with appropriate outpatient follow up. Diagnosis was discussed with patient who verbalizes understanding of care plan and is agreeable to discharge. I have discussed return precautions with patient who verbalizes understanding. Patient encouraged to follow-up with their PCP within 1 week. All questions answered.  Note: Portions of this report may have been transcribed using voice recognition software. Every effort was made to ensure accuracy; however, inadvertent computerized transcription errors may still be present.        Final Clinical Impression(s) / ED Diagnoses Final diagnoses:  Acute pain of right knee    Rx / DC Orders ED Discharge Orders     None         Michelle Piper,  Cordelia Poche 01/28/23 1027    Benjiman Core, MD 01/28/23 1515

## 2023-01-28 NOTE — Discharge Instructions (Addendum)
You have been seen today for your complaint of right knee pain. Your imaging showed a small amount of swelling but was otherwise reassuring. Your discharge medications include Alternate tylenol and ibuprofen for pain. You may alternate these every 4 hours. You may take up to 800 mg of ibuprofen at a time and up to 1000 mg of tylenol. Follow up with: Dr. Anette Riedel thin.  He is an Investment banker, operational.  Call to schedule a follow-up appointment. Please seek immediate medical care if you develop any of the following symptoms: Your knee swells, and the swelling gets worse. You cannot move your knee. You have very bad knee pain that does not get better with pain medicine. At this time there does not appear to be the presence of an emergent medical condition, however there is always the potential for conditions to change. Please read and follow the below instructions.  Do not take your medicine if  develop an itchy rash, swelling in your mouth or lips, or difficulty breathing; call 911 and seek immediate emergency medical attention if this occurs.  You may review your lab tests and imaging results in their entirety on your MyChart account.  Please discuss all results of fully with your primary care provider and other specialist at your follow-up visit.  Note: Portions of this text may have been transcribed using voice recognition software. Every effort was made to ensure accuracy; however, inadvertent computerized transcription errors may still be present.

## 2023-01-28 NOTE — ED Notes (Signed)
PA at bedside.

## 2023-01-28 NOTE — ED Triage Notes (Signed)
She c/o non-traumatic right knee pain x 1 week. She is able to slowly ambulate.

## 2023-03-10 DIAGNOSIS — R109 Unspecified abdominal pain: Secondary | ICD-10-CM | POA: Insufficient documentation

## 2023-03-10 DIAGNOSIS — R350 Frequency of micturition: Secondary | ICD-10-CM | POA: Insufficient documentation

## 2023-04-03 ENCOUNTER — Ambulatory Visit: Payer: Medicaid Other | Admitting: Emergency Medicine

## 2023-07-03 ENCOUNTER — Ambulatory Visit: Payer: Medicaid Other | Admitting: Neurology

## 2023-07-03 ENCOUNTER — Encounter: Payer: Self-pay | Admitting: Neurology

## 2023-07-03 VITALS — BP 120/81 | HR 82 | Ht 60.0 in | Wt 219.0 lb

## 2023-07-03 DIAGNOSIS — H538 Other visual disturbances: Secondary | ICD-10-CM | POA: Insufficient documentation

## 2023-07-03 DIAGNOSIS — R531 Weakness: Secondary | ICD-10-CM | POA: Diagnosis not present

## 2023-07-03 DIAGNOSIS — R202 Paresthesia of skin: Secondary | ICD-10-CM

## 2023-07-03 DIAGNOSIS — F419 Anxiety disorder, unspecified: Secondary | ICD-10-CM | POA: Diagnosis not present

## 2023-07-03 NOTE — Progress Notes (Addendum)
 Chief Complaint  Patient presents with   New Patient (Initial Visit)    Pt in 15, here alone Pt is referred for muscle weakness and fatigue. Pt states she is also having tingling on hands and feet.       ASSESSMENT AND PLAN  Hannah Morton is a 33 y.o. female   Anxiety Intermittent paresthesia, weakness, Occasionally headache, tinnitus, Obesity  Funduscopy examination showed blurry edge nasal field  MRI brain  Refer to ophthalmologist to rule out intracranial hypertension  DIAGNOSTIC DATA (LABS, IMAGING, TESTING) - I reviewed patient records, labs, notes, testing and imaging myself where available. Ophthalmology evaluation by Dr. Candi Chafe July 11, 2023, no disc edema, OCT RNFL OS/OD, normal ganglion cell complex, normal retinal nerve fiber layers, no disc edema,  HVF 24-2, full field.  MEDICAL HISTORY:  Hannah Morton is a 55-year-old female, seen in request by primary care for [facility] family medicine doctor Jacqulyne Maxim, for evaluation of constellation of complaints, initial evaluation July 03, 2023    History is obtained from the patient and review of electronic medical records. I personally reviewed pertinent available imaging films in PACS.   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, petroleum, doing lifting up to 50 pounds sometimes,  She also complains of intermittent blurry vision, tinnitus, hearing heartbeat in her ears, occasionally migraine headaches, about once a month, responding well to over-the-counter medications  She had extensive laboratory evaluation from [facility] in December 2024, LDL was 83, A1c was 5, CMP was normal with exception of slight low sodium 135, creatinine of 0.61, hemoglobin of  14.2, vitamin D of 97.3, CPK was 49, negative ANA C-reactive protein ESR thyroid  functional test, B12    PHYSICAL EXAM:   Vitals:   07/03/23 1556  BP: 120/81  Pulse: 82  Weight: 219 lb (99.3 kg)  Height: 5' (1.524 m)   Body mass index is 42.77 kg/m.  PHYSICAL EXAMNIATION:  Gen: NAD, conversant, well nourised, well groomed                     Cardiovascular: Regular rate rhythm, no peripheral edema, warm, nontender. Eyes: Conjunctivae clear without exudates or hemorrhage Neck: Supple, no carotid bruits. Pulmonary: Clear to auscultation bilaterally   NEUROLOGICAL EXAM:  MENTAL STATUS: Speech/cognition: Awake, alert, oriented to history taking and casual conversation CRANIAL NERVES: CN II: Visual fields are full to confrontation. Pupils are round equal and briskly reactive to light. CN III, IV, VI: extraocular movement are normal. No ptosis. CN V: Facial sensation is intact to light touch CN VII: Face is symmetric with normal eye closure  CN VIII: Hearing is normal to causal conversation. CN IX, X: Phonation is normal. CN XI: Head turning and shoulder shrug are intact  MOTOR: There is no pronator drift of out-stretched arms. Muscle bulk and tone are normal. Muscle strength is normal.  REFLEXES: Reflexes are 2+ and symmetric at the biceps, triceps, knees, and ankles. Plantar responses are flexor.  SENSORY: Intact to light touch, pinprick and vibratory sensation are intact in fingers and toes.  COORDINATION: There is no trunk or limb dysmetria noted.  GAIT/STANCE: Posture is normal. Gait is steady with normal steps, base, arm swing, and turning. Heel and toe walking are  normal. Tandem gait is normal.  Romberg is absent.  REVIEW OF SYSTEMS:  Full 14 system review of systems performed and notable only for as above All other review of systems were negative.   ALLERGIES: Allergies  Allergen Reactions   Codeine Rash    HOME MEDICATIONS: No current outpatient  medications on file.   No current facility-administered medications for this visit.    PAST MEDICAL HISTORY: Past Medical History:  Diagnosis Date   Frequent headaches    Migraines    Obesity (BMI 35.0-39.9 without comorbidity)     PAST SURGICAL HISTORY: Past Surgical History:  Procedure Laterality Date   CHOLECYSTECTOMY, LAPAROSCOPIC     WISDOM TOOTH EXTRACTION      FAMILY HISTORY: Family History  Problem Relation Age of Onset   Alcohol abuse Mother    COPD Mother    Hypertension Mother    Diabetes Father    Alcohol abuse Brother    Hypertension Brother    Arthritis Maternal Grandmother    Asthma Maternal Grandmother    Birth defects Maternal Grandmother    Cancer Maternal Grandmother    COPD Maternal Grandmother    Diabetes Maternal Grandmother    Heart disease Maternal Grandmother    Hyperlipidemia Maternal Grandmother    Stroke Maternal Grandfather    Stroke Paternal Grandfather    Heart attack Paternal Grandfather     SOCIAL HISTORY: Social History   Socioeconomic History   Marital status: Married    Spouse name: Not on file   Number of children: 3   Years of education: Not on file   Highest education level: Some college, no degree  Occupational History   Occupation: Hydrographic surveyor: [employer]  Tobacco Use   Smoking status: Never   Smokeless tobacco: Never  Vaping Use   Vaping status: Never Used  Substance and Sexual Activity   Alcohol use: Yes    Alcohol/week: 4.0 standard drinks of alcohol    Types: 2 Shots of liquor, 2 Standard drinks or equivalent per week    Comment: socially   Drug use: No   Sexual activity: Yes    Birth control/protection: Spermicide  Other Topics Concern   Not on file  Social History Narrative   Patient is a married mother of 2 sons.   She works as a Lawyer.     She may have 1-2 alcoholic drinks a week, but never smoked.   She does try to go walking with her sons on occasion, but does  not do routine exercise   Social Drivers of Health   Financial Resource Strain: Low Risk  (11/13/2022)   Overall Financial Resource Strain (CARDIA)    Difficulty of Paying Living Expenses: Not very hard  Food Insecurity: Low Risk  (03/10/2023)   Received from [facility]   Hunger Vital Sign    Worried About Running Out of Food in the Last Year: Never true    Ran Out of Food in the Last Year: Never true  Transportation Needs: No Transportation Needs (03/10/2023)   Received from [facility]    In the past 12 months, has lack of reliable transportation kept you from medical appointments, meetings, work or from getting things needed for daily living? : No  Physical Activity: Insufficiently Active (11/13/2022)   Exercise Vital Sign    Days of Exercise per Week: 3 days    Minutes of Exercise per Session: 30 min  Stress: No Stress  Concern Present (11/13/2022)   [employer] of Occupational Health - Occupational Stress Questionnaire    Feeling of Stress : Only a little  Social Connections: Unknown (11/13/2022)   Social Connection and Isolation Panel [NHANES]    Frequency of Communication with Friends and Family: Patient declined    Frequency of Social Gatherings with Friends and Family: Patient declined    Attends Religious Services: Patient declined    Database administrator or Organizations: No    Attends Engineer, structural: Not on file    Marital Status: Married  Catering manager Violence: Not on file      Phebe Brasil, M.D. Ph.D.  [facility] [address], [address] [place], [place] [address] Ph: [phone] Fax: [phone]  CC:  Jacqulyne Maxim, MD [address] [place],  [place] [address]  Elvira Hammersmith, MD

## 2023-07-04 ENCOUNTER — Telehealth: Payer: Self-pay | Admitting: Neurology

## 2023-07-04 NOTE — Telephone Encounter (Signed)
 Referral for ophthalmology fax to [facility]. Phone: [phone], Fax: [phone]

## 2023-07-06 ENCOUNTER — Telehealth: Payer: Self-pay | Admitting: Neurology

## 2023-07-06 NOTE — Telephone Encounter (Signed)
 [facility] [employer]: [id number] exp. 07/06/23-09/04/23 sent to [facility] [phone]

## 2023-07-16 ENCOUNTER — Other Ambulatory Visit

## 2023-07-17 ENCOUNTER — Encounter (INDEPENDENT_AMBULATORY_CARE_PROVIDER_SITE_OTHER): Payer: Self-pay | Admitting: Ophthalmology

## 2023-07-17 ENCOUNTER — Encounter (INDEPENDENT_AMBULATORY_CARE_PROVIDER_SITE_OTHER): Admitting: Ophthalmology

## 2023-07-17 ENCOUNTER — Ambulatory Visit (INDEPENDENT_AMBULATORY_CARE_PROVIDER_SITE_OTHER): Admitting: Ophthalmology

## 2023-07-17 DIAGNOSIS — H35411 Lattice degeneration of retina, right eye: Secondary | ICD-10-CM

## 2023-07-17 DIAGNOSIS — H33301 Unspecified retinal break, right eye: Secondary | ICD-10-CM | POA: Diagnosis not present

## 2023-07-17 NOTE — Progress Notes (Addendum)
 [facility] - Clinic Note  07/17/2023   CHIEF COMPLAINT Patient presents for Retina Evaluation  HISTORY OF PRESENT ILLNESS: Hannah Morton is a 33 y.o. female who presents to the clinic today for:  HPI     Retina Evaluation   Associated Symptoms Floaters.  I, the attending physician,  performed the HPI with the patient and updated documentation appropriately.        Comments   Patient here for Retina Evaluation. Referred by Dr Diedre Fox. Patient states vision feels like ok sometimes. Blurred sometimes or like something in eye. May need glasses. On occasion has a little circle float by. Has some eye pain OU. Uses Refresh prn. Not taking any medications.       Last edited by Ronelle Coffee, MD on 07/17/2023 12:44 PM.    Pt is here on the referral of Dr. Candi Chafe for concern of lattice / retinal hole, pt was referred to Dr. Candi Chafe by her neurologist, she is being checked for intracranial hypertension, pt states her vision is a little blurry, she sees occasional floaters, she denies being diabetic or hypertensive   Referring physician: Sidra Dredge, MD [address] [place],  [place] [address]  HISTORICAL INFORMATION:  Selected notes from the MEDICAL RECORD NUMBER Referred by Dr. Candi Chafe for concern of lattice / retinal hole LEE:  Ocular Hx- PMH-   CURRENT MEDICATIONS: No current outpatient medications on file. (Ophthalmic Drugs)   No current facility-administered medications for this visit. (Ophthalmic Drugs)   No current outpatient medications on file. (Other)   No current facility-administered medications for this visit. (Other)   REVIEW OF SYSTEMS: ROS   Positive for: Eyes Last edited by Sylvan Evener, COA on 07/17/2023  8:07 AM.     ALLERGIES Allergies  Allergen Reactions   Codeine Rash   PAST MEDICAL HISTORY Past Medical History:  Diagnosis Date   Frequent headaches    Migraines    Obesity (BMI 35.0-39.9 without comorbidity)     Past Surgical History:  Procedure Laterality Date   CHOLECYSTECTOMY, LAPAROSCOPIC     WISDOM TOOTH EXTRACTION     FAMILY HISTORY Family History  Problem Relation Age of Onset   Alcohol abuse Mother    COPD Mother    Hypertension Mother    Diabetes Father    Alcohol abuse Brother    Hypertension Brother    Arthritis Maternal Grandmother    Asthma Maternal Grandmother    Birth defects Maternal Grandmother    Cancer Maternal Grandmother    COPD Maternal Grandmother    Diabetes Maternal Grandmother    Heart disease Maternal Grandmother    Hyperlipidemia Maternal Grandmother    Stroke Maternal Grandfather    Stroke Paternal Grandfather    Heart attack Paternal Grandfather    SOCIAL HISTORY Social History   Tobacco Use   Smoking status: Never   Smokeless tobacco: Never  Vaping Use   Vaping status: Never Used  Substance Use Topics   Alcohol use: Yes    Alcohol/week: 4.0 standard drinks of alcohol    Types: 2 Shots of liquor, 2 Standard drinks or equivalent per week    Comment: socially   Drug use: No       OPHTHALMIC EXAM:  Base Eye Exam     Visual Acuity (Snellen - Linear)       Right Left   Dist [place] 20/20 20/20         Tonometry (Tonopen, 8:04  AM)       Right Left   Pressure 19 19         Pupils       Dark Light Shape React APD   Right 4 3 Round Brisk None   Left 4 3 Round Brisk None         Visual Fields (Counting fingers)       Left Right    Full Full         Extraocular Movement       Right Left    Full, Ortho Full, Ortho         Neuro/Psych     Oriented x3: Yes   Mood/Affect: Normal         Dilation     Both eyes: 1.0% Mydriacyl, 2.5% Phenylephrine  @ 8:04 AM           Slit Lamp and Fundus Exam     Slit Lamp Exam       Right Left   Lids/Lashes Normal Normal   Conjunctiva/Sclera mild melanosis mild melanosis   Cornea Clear Clear   Anterior Chamber deep, clear, narrow temporal angle deep, clear, narrow  temporal angle   Iris Round and dilated Round and dilated   Lens Clear Clear   Anterior Vitreous mild syneresis, vitreous condensations inferiorly mild syneresis         Fundus Exam       Right Left   Disc Pink and Sharp Pink and Sharp   C/D Ratio 0.2 0.2   Macula Flat, Good foveal reflex, No heme or edema Flat, Good foveal reflex, No heme or edema   Vessels Normal Normal   Periphery Attached, focal pigmented VR tuft at 0730 equator, mild peripheral cystoid degeneration, pigmented lattice from 1030-1100 Attached, no heme or lattice, trace peripheral cystoid degeneration           IMAGING AND PROCEDURES  Imaging and Procedures for 07/17/2023  OCT, Retina - OU - Both Eyes       Right Eye Quality was good. Central Foveal Thickness: 256. Progression has no prior data. Findings include normal foveal contour, no IRF, no SRF, vitreomacular adhesion .   Left Eye Quality was good. Central Foveal Thickness: 240. Progression has no prior data. Findings include normal foveal contour, no IRF, no SRF, vitreomacular adhesion .   Notes *Images captured and stored on drive  Diagnosis / Impression:  NFP, no IRF / SRF OU  Clinical management:  See below  Abbreviations: NFP - Normal foveal profile. CME - cystoid macular edema. PED - pigment epithelial detachment. IRF - intraretinal fluid. SRF - subretinal fluid. EZ - ellipsoid zone. ERM - epiretinal membrane. ORA - outer retinal atrophy. ORT - outer retinal tubulation. SRHM - subretinal hyper-reflective material. 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 Right retinal defect  H33.301      1,2. Lattice degeneration w/ retinal defect, right eye  - pt asymptomatic, BCVA 20/20 OU - pigmented lattice from 1030-1100; focal pigmented VR tuft at 0730 equator - discussed findings, prognosis, and treatment  options including observation - recommend laser retinopexy OD  - RBA of procedure discussed, questions answered - will bring pt back next week - f/u Monday at 245 -- laser retinopexy OD   Ophthalmic Meds Ordered this visit:  No  orders of the defined types were placed in this encounter.    Return in about 1 week (around 07/24/2023) for f/u lattice degeneration OD, OCT, Laser.  There are no Patient Instructions on file for this visit.  Explained the diagnoses, plan, and follow up with the patient and they expressed understanding.  Patient expressed understanding of the importance of proper follow up care.   This document serves as a record of services personally performed by Jeanice Millard, MD, PhD. It was created on their behalf by Morley Arabia. Bevin Bucks, OA an ophthalmic technician. The creation of this record is the provider's dictation and/or activities during the visit.    Electronically signed by: Morley Arabia. Bevin Bucks, OA 07/17/23 1:06 PM  Jeanice Millard, M.D., Ph.D. Diseases & Surgery of the Retina and Vitreous [facility] 07/17/2023  I have reviewed the above documentation for accuracy and completeness, and I agree with the above. Jeanice Millard, M.D., Ph.D. 07/17/23 1:06 PM   Abbreviations: M myopia (nearsighted); A astigmatism; H hyperopia (farsighted); P presbyopia; Mrx spectacle prescription;  CTL contact lenses; OD right eye; OS left eye; OU both eyes  XT exotropia; ET esotropia; PEK punctate epithelial keratitis; PEE punctate epithelial erosions; DES dry eye syndrome; MGD meibomian gland dysfunction; ATs artificial tears; PFAT's preservative free artificial tears; NSC nuclear sclerotic cataract; PSC posterior subcapsular cataract; ERM epi-retinal membrane; PVD posterior vitreous detachment; RD retinal detachment; DM diabetes mellitus; DR diabetic retinopathy; NPDR non-proliferative diabetic retinopathy; PDR proliferative diabetic retinopathy; CSME clinically  significant macular edema; DME diabetic macular edema; dbh dot blot hemorrhages; CWS cotton wool spot; POAG primary open angle glaucoma; C/D cup-to-disc ratio; HVF humphrey visual field; GVF goldmann visual field; OCT optical coherence tomography; IOP intraocular pressure; BRVO Branch retinal vein occlusion; CRVO central retinal vein occlusion; CRAO central retinal artery occlusion; BRAO branch retinal artery occlusion; RT retinal tear; SB scleral buckle; PPV pars plana vitrectomy; VH Vitreous hemorrhage; PRP panretinal laser photocoagulation; IVK intravitreal kenalog; VMT vitreomacular traction; MH Macular hole;  NVD neovascularization of the disc; NVE neovascularization elsewhere; AREDS age related eye disease study; ARMD age related macular degeneration; POAG primary open angle glaucoma; EBMD epithelial/anterior basement membrane dystrophy; ACIOL anterior chamber intraocular lens; IOL intraocular lens; PCIOL posterior chamber intraocular lens; Phaco/IOL phacoemulsification with intraocular lens placement; PRK photorefractive keratectomy; LASIK laser assisted in situ keratomileusis; HTN hypertension; DM diabetes mellitus; COPD chronic obstructive pulmonary disease

## 2023-07-23 ENCOUNTER — Other Ambulatory Visit

## 2023-07-23 ENCOUNTER — Ambulatory Visit
Admission: RE | Admit: 2023-07-23 | Discharge: 2023-07-23 | Disposition: A | Discharge status: Home / Self Care | Source: Ambulatory Visit | Attending: Neurology | Admitting: Neurology

## 2023-07-23 DIAGNOSIS — R202 Paresthesia of skin: Secondary | ICD-10-CM

## 2023-07-23 DIAGNOSIS — H538 Other visual disturbances: Secondary | ICD-10-CM

## 2023-07-23 DIAGNOSIS — R531 Weakness: Secondary | ICD-10-CM

## 2023-07-24 ENCOUNTER — Encounter (INDEPENDENT_AMBULATORY_CARE_PROVIDER_SITE_OTHER): Payer: Self-pay | Admitting: Ophthalmology

## 2023-07-24 ENCOUNTER — Ambulatory Visit (INDEPENDENT_AMBULATORY_CARE_PROVIDER_SITE_OTHER): Admitting: Ophthalmology

## 2023-07-24 DIAGNOSIS — H35411 Lattice degeneration of retina, right eye: Secondary | ICD-10-CM

## 2023-07-24 DIAGNOSIS — H33301 Unspecified retinal break, right eye: Secondary | ICD-10-CM

## 2023-07-24 MED ORDER — PREDNISOLONE ACETATE 1 % OP SUSP: 1.0000 [drp] | Freq: Four times a day (QID) | OPHTHALMIC | 0 refills | Status: AC

## 2023-07-24 NOTE — Progress Notes (Signed)
 [facility] - Clinic Note  07/24/2023   CHIEF COMPLAINT Patient presents for Laser Treatment  HISTORY OF PRESENT ILLNESS: Hannah Morton is a 33 y.o. 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 Patient states no changes in vision. Pt states she is noticing flashes occasionally in the mornings. Pt states floaters have been the same. Pt states there is a consistent dull pain in her right eye. Pt is using refresh prn. Last edited by Ronelle Coffee, MD on 07/24/2023  5:34 PM.     Referring physician: Sidra Dredge, MD [address] [place],  [place] [address]  HISTORICAL INFORMATION:  Selected notes from the MEDICAL RECORD NUMBER Referred by Dr. Candi Chafe for concern of lattice / retinal hole LEE:  Ocular Hx- PMH-   CURRENT MEDICATIONS: Current Outpatient Medications (Ophthalmic Drugs)  Medication Sig   prednisoLONE acetate (PRED FORTE) 1 % ophthalmic suspension Place 1 drop into the right eye 4 (four) times daily for 7 days.   No current facility-administered medications for this visit. (Ophthalmic Drugs)   No current outpatient medications on file. (Other)   No current facility-administered medications for this visit. (Other)   REVIEW OF SYSTEMS: ROS   Positive for: Eyes Last edited by Carrington Clack, COT on 07/24/2023  2:38 PM.     ALLERGIES Allergies  Allergen Reactions   Codeine Rash   PAST MEDICAL HISTORY Past Medical History:  Diagnosis Date   Frequent headaches    Migraines    Obesity (BMI 35.0-39.9 without comorbidity)    Past Surgical History:  Procedure Laterality Date   CHOLECYSTECTOMY, LAPAROSCOPIC     WISDOM TOOTH EXTRACTION     FAMILY HISTORY Family History  Problem Relation Age of Onset   Alcohol abuse Mother    COPD Mother    Hypertension Mother    Diabetes Father    Alcohol abuse Brother     Hypertension Brother    Arthritis Maternal Grandmother    Asthma Maternal Grandmother    Birth defects Maternal Grandmother    Cancer Maternal Grandmother    COPD Maternal Grandmother    Diabetes Maternal Grandmother    Heart disease Maternal Grandmother    Hyperlipidemia Maternal Grandmother    Stroke Maternal Grandfather    Stroke Paternal Grandfather    Heart attack Paternal Grandfather    SOCIAL HISTORY Social History   Tobacco Use   Smoking status: Never   Smokeless tobacco: Never  Vaping Use   Vaping status: Never Used  Substance Use Topics   Alcohol use: Yes    Alcohol/week: 4.0 standard drinks of alcohol    Types: 2 Shots of liquor, 2 Standard drinks or equivalent per week    Comment: socially   Drug use: No       OPHTHALMIC EXAM:  Base Eye Exam     Visual Acuity (Snellen - Linear)       Right Left   Dist [place] 20/20 20/20         Tonometry (Tonopen, 2:42 PM)       Right Left   Pressure 20 18         Pupils       Pupils Dark Light Shape React APD   Right PERRL 4 3 Round Brisk None   Left PERRL 4 3 Round Brisk None  Visual Fields       Left Right    Full Full         Extraocular Movement       Right Left    Full, Ortho Full, Ortho         Neuro/Psych     Oriented x3: Yes   Mood/Affect: Normal         Dilation     Both eyes: 1.0% Mydriacyl, 2.5% Phenylephrine  @ 2:45 PM           Slit Lamp and Fundus Exam     Slit Lamp Exam       Right Left   Lids/Lashes Normal Normal   Conjunctiva/Sclera mild melanosis mild melanosis   Cornea Clear Clear   Anterior Chamber deep, clear, narrow temporal angle deep, clear, narrow temporal angle   Iris Round and dilated Round and dilated   Lens Clear Clear   Anterior Vitreous mild syneresis, vitreous condensations inferiorly mild syneresis         Fundus Exam       Right Left   Disc Pink and Sharp Pink and Sharp   C/D Ratio 0.2 0.2   Macula Flat, Good foveal  reflex, No heme or edema Flat, Good foveal reflex, No heme or edema   Vessels Normal Normal   Periphery Attached, focal pigmented VR tuft at 0730 equator, mild peripheral cystoid degeneration, pigmented lattice from 1030-1100 Attached, no heme or lattice, trace peripheral cystoid degeneration           IMAGING AND PROCEDURES  Imaging and Procedures for 07/24/2023  OCT, Retina - OU - Both Eyes        Right Eye Quality was good. Central Foveal Thickness: 263. Progression has been stable. Findings include normal foveal contour, no IRF, no SRF, vitreomacular adhesion .   Left Eye Quality was good. Central Foveal Thickness: 250. Progression has been stable. Findings include normal foveal contour, no IRF, no SRF, vitreomacular adhesion .   Notes  *Images captured and stored on drive  Diagnosis / Impression:  NFP, no IRF / SRF OU  Clinical management:  See below  Abbreviations: NFP - Normal foveal profile. CME - cystoid macular edema. PED - pigment epithelial detachment. IRF - intraretinal fluid. SRF - subretinal fluid. EZ - ellipsoid zone. ERM - epiretinal membrane. ORA - outer retinal atrophy. ORT - outer retinal tubulation. SRHM - subretinal hyper-reflective material. IRHM - intraretinal hyper-reflective material      Repair Retinal Breaks, Laser - OD - Right Eye        LASER PROCEDURE NOTE  Procedure:  Barrier laser retinopexy using slit lamp laser, RIGHT eye   Diagnosis:   Lattice degeneration w/ atrophic holes, RIGHT eye                     Patch of pigmented lattice from 1030-1100; focal pigmented VR tuft at 0730  Surgeon: Ronelle Coffee, MD, PhD  Anesthesia: Topical  Informed consent obtained, operative eye marked, and time out performed prior to initiation of laser.   Laser settings:  Lumenis [id number] laser, slit lamp Lens: Mainster PRP 165 Power: 250 mW Spot size: 300 microns Duration: 30 msec  # spots: 225  Placement of laser: Using a Mainster PRP 165  contact lens at the slit lamp, laser was placed in three confluent rows around patch of lattice w/ atrophic holes spanning 1030-1100 oclock anterior to equator and around pigmented VR tuft at 0730 equator.  Complications: None.  Patient tolerated the procedure well and received written and verbal post-procedure care information/education.          ASSESSMENT/PLAN:   ICD-10-CM   1. Lattice degeneration of right retina  H35.411 OCT, Retina - OU - Both Eyes    Repair Retinal Breaks, Laser - OD - Right Eye    2. Right retinal defect  H33.301 OCT, Retina - OU - Both Eyes    Repair Retinal Breaks, Laser - OD - Right Eye     1,2. Lattice degeneration w/ retinal defect, right eye  - pt asymptomatic, BCVA 20/20 OU - pigmented lattice from 1030-1100; focal pigmented VR tuft at 0730 equator - discussed findings, prognosis, and treatment options - recommend laser retinopexy OD today, 05.12.25 - RBA of procedure discussed, questions answered - informed consent obtained and signed - see procedure note  - start PF QID OD x7 days - f/u 2-3 wks -- DFE/OCT   Ophthalmic Meds Ordered this visit:  Meds ordered this encounter  Medications   prednisoLONE acetate (PRED FORTE) 1 % ophthalmic suspension    Sig: Place 1 drop into the right eye 4 (four) times daily for 7 days.    Dispense:  10 mL    Refill:  0     Return for 2-3 wks - s/p laser retinopexy OD for lattice and VR tuft.  There are no Patient Instructions on file for this visit.  Explained the diagnoses, plan, and follow up with the patient and they expressed understanding.  Patient expressed understanding of the importance of proper follow up care.   This document serves as a record of services personally performed by Jeanice Millard, MD, PhD. It was created on their behalf by Morley Arabia. Bevin Bucks, OA an ophthalmic technician. The creation of this record is the provider's dictation and/or activities during the visit.    Electronically  signed by: Morley Arabia. Bevin Bucks, OA 07/24/23 5:38 PM  Jeanice Millard, M.D., Ph.D. Diseases & Surgery of the Retina and Vitreous [facility] 07/24/2023  I have reviewed the above documentation for accuracy and completeness, and I agree with the above. Jeanice Millard, M.D., Ph.D. 07/24/23 5:38 PM   Abbreviations: M myopia (nearsighted); A astigmatism; H hyperopia (farsighted); P presbyopia; Mrx spectacle prescription;  CTL contact lenses; OD right eye; OS left eye; OU both eyes  XT exotropia; ET esotropia; PEK punctate epithelial keratitis; PEE punctate epithelial erosions; DES dry eye syndrome; MGD meibomian gland dysfunction; ATs artificial tears; PFAT's preservative free artificial tears; NSC nuclear sclerotic cataract; PSC posterior subcapsular cataract; ERM epi-retinal membrane; PVD posterior vitreous detachment; RD retinal detachment; DM diabetes mellitus; DR diabetic retinopathy; NPDR non-proliferative diabetic retinopathy; PDR proliferative diabetic retinopathy; CSME clinically significant macular edema; DME diabetic macular edema; dbh dot blot hemorrhages; CWS cotton wool spot; POAG primary open angle glaucoma; C/D cup-to-disc ratio; HVF humphrey visual field; GVF goldmann visual field; OCT optical coherence tomography; IOP intraocular pressure; BRVO Branch retinal vein occlusion; CRVO central retinal vein occlusion; CRAO central retinal artery occlusion; BRAO branch retinal artery occlusion; RT retinal tear; SB scleral buckle; PPV pars plana vitrectomy; VH Vitreous hemorrhage; PRP panretinal laser photocoagulation; IVK intravitreal kenalog; VMT vitreomacular traction; MH Macular hole;  NVD neovascularization of the disc; NVE neovascularization elsewhere; AREDS age related eye disease study; ARMD age related macular degeneration; POAG primary open angle glaucoma; EBMD epithelial/anterior basement membrane dystrophy; ACIOL anterior chamber intraocular lens; IOL intraocular lens;  PCIOL posterior chamber intraocular lens; Phaco/IOL phacoemulsification with intraocular  lens placement; PRK photorefractive keratectomy; LASIK laser assisted in situ keratomileusis; HTN hypertension; DM diabetes mellitus; COPD chronic obstructive pulmonary disease

## 2023-07-26 ENCOUNTER — Ambulatory Visit: Payer: Self-pay | Admitting: Neurology

## 2023-07-28 NOTE — Progress Notes (Shared)
 [facility] - Clinic Note  08/09/2023   CHIEF COMPLAINT Patient presents for No chief complaint on file.  HISTORY OF PRESENT ILLNESS: Hannah Morton is a 33 y.o. female who presents to the clinic today for:    Referring physician: Sidra Dredge, MD [address] [place],  [place] [address]  HISTORICAL INFORMATION:  Selected notes from the MEDICAL RECORD NUMBER Referred by Dr. Candi Chafe for concern of lattice / retinal hole LEE:  Ocular Hx- PMH-   CURRENT MEDICATIONS: Current Outpatient Medications (Ophthalmic Drugs)  Medication Sig   prednisoLONE acetate (PRED FORTE) 1 % ophthalmic suspension Place 1 drop into the right eye 4 (four) times daily for 7 days.   No current facility-administered medications for this visit. (Ophthalmic Drugs)   No current outpatient medications on file. (Other)   No current facility-administered medications for this visit. (Other)   REVIEW OF SYSTEMS:   ALLERGIES Allergies  Allergen Reactions   Codeine Rash   PAST MEDICAL HISTORY Past Medical History:  Diagnosis Date   Frequent headaches    Migraines    Obesity (BMI 35.0-39.9 without comorbidity)    Past Surgical History:  Procedure Laterality Date   CHOLECYSTECTOMY, LAPAROSCOPIC     WISDOM TOOTH EXTRACTION     FAMILY HISTORY Family History  Problem Relation Age of Onset   Alcohol abuse Mother    COPD Mother    Hypertension Mother    Diabetes Father    Alcohol abuse Brother    Hypertension Brother    Arthritis Maternal Grandmother    Asthma Maternal Grandmother    Birth defects Maternal Grandmother    Cancer Maternal Grandmother    COPD Maternal Grandmother    Diabetes Maternal Grandmother    Heart disease Maternal Grandmother    Hyperlipidemia Maternal Grandmother    Stroke Maternal Grandfather    Stroke Paternal Grandfather    Heart attack Paternal Grandfather    SOCIAL HISTORY Social History   Tobacco Use   Smoking status: Never    Smokeless tobacco: Never  Vaping Use   Vaping status: Never Used  Substance Use Topics   Alcohol use: Yes    Alcohol/week: 4.0 standard drinks of alcohol    Types: 2 Shots of liquor, 2 Standard drinks or equivalent per week    Comment: socially   Drug use: No       OPHTHALMIC EXAM:  Not recorded    IMAGING AND PROCEDURES  Imaging and Procedures for 08/09/2023         ASSESSMENT/PLAN: No diagnosis found.  1,2. Lattice degeneration w/ retinal defect, right eye  - pt asymptomatic, BCVA 20/20 OU - pigmented lattice from 1030-1100; focal pigmented VR tuft at 0730 equator - discussed findings, prognosis, and treatment options - s/p laser retinopexy OD 05.12.25 - RBA of procedure discussed, questions answered - informed consent obtained and signed - see procedure note - f/u 2-3 wks -- DFE/OCT   Ophthalmic Meds Ordered this visit:  No orders of the defined types were placed in this encounter.    No follow-ups on file.  There are no Patient Instructions on file for this visit.  Explained the diagnoses, plan, and follow up with the patient and they expressed understanding.  Patient expressed understanding of the importance of proper follow up care.   This document serves as a record of services personally performed by Jeanice Millard, MD, PhD. It was created on their behalf by Angelia Kelp, an ophthalmic technician.  The creation of this record is the provider's dictation and/or activities during the visit.    Electronically signed by: Angelia Kelp, OA, 07/28/23  1:11 PM   Jeanice Millard, M.D., Ph.D. Diseases & Surgery of the Retina and Vitreous [facility] 08/09/2023   Abbreviations: M myopia (nearsighted); A astigmatism; H hyperopia (farsighted); P presbyopia; Mrx spectacle prescription;  CTL contact lenses; OD right eye; OS left eye; OU both eyes  XT exotropia; ET esotropia; PEK punctate epithelial keratitis; PEE punctate epithelial  erosions; DES dry eye syndrome; MGD meibomian gland dysfunction; ATs artificial tears; PFAT's preservative free artificial tears; NSC nuclear sclerotic cataract; PSC posterior subcapsular cataract; ERM epi-retinal membrane; PVD posterior vitreous detachment; RD retinal detachment; DM diabetes mellitus; DR diabetic retinopathy; NPDR non-proliferative diabetic retinopathy; PDR proliferative diabetic retinopathy; CSME clinically significant macular edema; DME diabetic macular edema; dbh dot blot hemorrhages; CWS cotton wool spot; POAG primary open angle glaucoma; C/D cup-to-disc ratio; HVF humphrey visual field; GVF goldmann visual field; OCT optical coherence tomography; IOP intraocular pressure; BRVO Branch retinal vein occlusion; CRVO central retinal vein occlusion; CRAO central retinal artery occlusion; BRAO branch retinal artery occlusion; RT retinal tear; SB scleral buckle; PPV pars plana vitrectomy; VH Vitreous hemorrhage; PRP panretinal laser photocoagulation; IVK intravitreal kenalog; VMT vitreomacular traction; MH Macular hole;  NVD neovascularization of the disc; NVE neovascularization elsewhere; AREDS age related eye disease study; ARMD age related macular degeneration; POAG primary open angle glaucoma; EBMD epithelial/anterior basement membrane dystrophy; ACIOL anterior chamber intraocular lens; IOL intraocular lens; PCIOL posterior chamber intraocular lens; Phaco/IOL phacoemulsification with intraocular lens placement; PRK photorefractive keratectomy; LASIK laser assisted in situ keratomileusis; HTN hypertension; DM diabetes mellitus; COPD chronic obstructive pulmonary disease

## 2023-08-03 NOTE — Progress Notes (Shared)
 [facility] - Clinic Note  08/09/2023   CHIEF COMPLAINT Patient presents for No chief complaint on file.  HISTORY OF PRESENT ILLNESS: Hannah Morton is a 33 y.o. female who presents to the clinic today for:    Referring physician: Candi Chafe, Pearletha Bouche, MD [address] [place],  [place] [address]  HISTORICAL INFORMATION:  Selected notes from the MEDICAL RECORD NUMBER Referred by Dr. Candi Chafe for concern of lattice / retinal hole LEE:  Ocular Hx- PMH-   CURRENT MEDICATIONS: No current outpatient medications on file. (Ophthalmic Drugs)   No current facility-administered medications for this visit. (Ophthalmic Drugs)   No current outpatient medications on file. (Other)   No current facility-administered medications for this visit. (Other)   REVIEW OF SYSTEMS:   ALLERGIES Allergies  Allergen Reactions   Codeine Rash   PAST MEDICAL HISTORY Past Medical History:  Diagnosis Date   Frequent headaches    Migraines    Obesity (BMI 35.0-39.9 without comorbidity)    Past Surgical History:  Procedure Laterality Date   CHOLECYSTECTOMY, LAPAROSCOPIC     WISDOM TOOTH EXTRACTION     FAMILY HISTORY Family History  Problem Relation Age of Onset   Alcohol abuse Mother    COPD Mother    Hypertension Mother    Diabetes Father    Alcohol abuse Brother    Hypertension Brother    Arthritis Maternal Grandmother    Asthma Maternal Grandmother    Birth defects Maternal Grandmother    Cancer Maternal Grandmother    COPD Maternal Grandmother    Diabetes Maternal Grandmother    Heart disease Maternal Grandmother    Hyperlipidemia Maternal Grandmother    Stroke Maternal Grandfather    Stroke Paternal Grandfather    Heart attack Paternal Grandfather    SOCIAL HISTORY Social History   Tobacco Use   Smoking status: Never   Smokeless tobacco: Never  Vaping Use   Vaping status: Never Used  Substance Use Topics   Alcohol use: Yes    Alcohol/week: 4.0  standard drinks of alcohol    Types: 2 Shots of liquor, 2 Standard drinks or equivalent per week    Comment: socially   Drug use: No       OPHTHALMIC EXAM:  Not recorded    IMAGING AND PROCEDURES  Imaging and Procedures for 08/09/2023         ASSESSMENT/PLAN: No diagnosis found.  1,2. Lattice degeneration w/ retinal defect, right eye  - pt asymptomatic, BCVA 20/20 OU - pigmented lattice from 1030-1100; focal pigmented VR tuft at 0730 equator - discussed findings, prognosis, and treatment options - s/p laser retinopexy OD 05.12.25 - RBA of procedure discussed, questions answered - informed consent obtained and signed - see procedure note - f/u 2-3 wks -- DFE/OCT   Ophthalmic Meds Ordered this visit:  No orders of the defined types were placed in this encounter.    No follow-ups on file.  There are no Patient Instructions on file for this visit.  Explained the diagnoses, plan, and follow up with the patient and they expressed understanding.  Patient expressed understanding of the importance of proper follow up care.   This document serves as a record of services personally performed by Jeanice Millard, MD, PhD. It was created on their behalf by Angelia Kelp, an ophthalmic technician. The creation of this record is the provider's dictation and/or activities during the visit.    Electronically signed by: Angelia Kelp, OA, 08/03/23  9:52 AM   Jeanice Millard, M.D., Ph.D. Diseases & Surgery of the Retina and Vitreous [facility] 08/09/2023   Abbreviations: M myopia (nearsighted); A astigmatism; H hyperopia (farsighted); P presbyopia; Mrx spectacle prescription;  CTL contact lenses; OD right eye; OS left eye; OU both eyes  XT exotropia; ET esotropia; PEK punctate epithelial keratitis; PEE punctate epithelial erosions; DES dry eye syndrome; MGD meibomian gland dysfunction; ATs artificial tears; PFAT's preservative free artificial tears; NSC  nuclear sclerotic cataract; PSC posterior subcapsular cataract; ERM epi-retinal membrane; PVD posterior vitreous detachment; RD retinal detachment; DM diabetes mellitus; DR diabetic retinopathy; NPDR non-proliferative diabetic retinopathy; PDR proliferative diabetic retinopathy; CSME clinically significant macular edema; DME diabetic macular edema; dbh dot blot hemorrhages; CWS cotton wool spot; POAG primary open angle glaucoma; C/D cup-to-disc ratio; HVF humphrey visual field; GVF goldmann visual field; OCT optical coherence tomography; IOP intraocular pressure; BRVO Branch retinal vein occlusion; CRVO central retinal vein occlusion; CRAO central retinal artery occlusion; BRAO branch retinal artery occlusion; RT retinal tear; SB scleral buckle; PPV pars plana vitrectomy; VH Vitreous hemorrhage; PRP panretinal laser photocoagulation; IVK intravitreal kenalog; VMT vitreomacular traction; MH Macular hole;  NVD neovascularization of the disc; NVE neovascularization elsewhere; AREDS age related eye disease study; ARMD age related macular degeneration; POAG primary open angle glaucoma; EBMD epithelial/anterior basement membrane dystrophy; ACIOL anterior chamber intraocular lens; IOL intraocular lens; PCIOL posterior chamber intraocular lens; Phaco/IOL phacoemulsification with intraocular lens placement; PRK photorefractive keratectomy; LASIK laser assisted in situ keratomileusis; HTN hypertension; DM diabetes mellitus; COPD chronic obstructive pulmonary disease

## 2023-08-09 ENCOUNTER — Encounter (INDEPENDENT_AMBULATORY_CARE_PROVIDER_SITE_OTHER): Admitting: Ophthalmology

## 2023-08-09 DIAGNOSIS — H33301 Unspecified retinal break, right eye: Secondary | ICD-10-CM

## 2023-08-09 DIAGNOSIS — H35411 Lattice degeneration of retina, right eye: Secondary | ICD-10-CM

## 2023-08-10 NOTE — Progress Notes (Signed)
 [facility] - Clinic Note  08/11/2023   CHIEF COMPLAINT Patient presents for Retina Follow Up  HISTORY OF PRESENT ILLNESS: Hannah Morton is a 33 y.o. female who presents to the clinic today for:  HPI     Retina Follow Up   In right eye.  This started 4 weeks ago.  Severity is moderate.  Duration of 4 weeks.  Since onset it is gradually improving.  I, the attending physician,  performed the HPI with the patient and updated documentation appropriately.        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 Pt states no changes in vision. Pt states she is noticing fewer floaters. Pt denies FOL/pain.Pt is using refresh prn.      Last edited by Ronelle Coffee, MD on 08/11/2023  4:20 PM.    Pt states she had no problems after the laser procedure, she had a floater for a few days afterwards and used PF for a week as directed  Referring physician: Sidra Dredge, MD [address] [place],  [place] [address]  HISTORICAL INFORMATION:  Selected notes from the MEDICAL RECORD NUMBER Referred by Dr. Candi Chafe for concern of lattice / retinal hole LEE:  Ocular Hx- PMH-   CURRENT MEDICATIONS: No current outpatient medications on file. (Ophthalmic Drugs)   No current facility-administered medications for this visit. (Ophthalmic Drugs)   No current outpatient medications on file. (Other)   No current facility-administered medications for this visit. (Other)   REVIEW OF SYSTEMS: ROS   Positive for: Eyes Last edited by Carrington Clack, COT on 08/11/2023  1:50 PM.      ALLERGIES Allergies  Allergen Reactions   Codeine Rash   PAST MEDICAL HISTORY Past Medical History:  Diagnosis Date   Frequent headaches    Migraines    Obesity (BMI 35.0-39.9 without comorbidity)    Past Surgical History:  Procedure Laterality Date   CHOLECYSTECTOMY, LAPAROSCOPIC     WISDOM TOOTH EXTRACTION     FAMILY HISTORY Family  History  Problem Relation Age of Onset   Alcohol abuse Mother    COPD Mother    Hypertension Mother    Diabetes Father    Alcohol abuse Brother    Hypertension Brother    Arthritis Maternal Grandmother    Asthma Maternal Grandmother    Birth defects Maternal Grandmother    Cancer Maternal Grandmother    COPD Maternal Grandmother    Diabetes Maternal Grandmother    Heart disease Maternal Grandmother    Hyperlipidemia Maternal Grandmother    Stroke Maternal Grandfather    Stroke Paternal Grandfather    Heart attack Paternal Grandfather    SOCIAL HISTORY Social History   Tobacco Use   Smoking status: Never   Smokeless tobacco: Never  Vaping Use   Vaping status: Never Used  Substance Use Topics   Alcohol use: Yes    Alcohol/week: 4.0 standard drinks of alcohol    Types: 2 Shots of liquor, 2 Standard drinks or equivalent per week    Comment: socially   Drug use: No       OPHTHALMIC EXAM:  Base Eye Exam     Visual Acuity (Snellen - Linear)       Right Left   Dist [place] 20/20 20/20         Tonometry (Tonopen, 1:47 PM)       Right Left  Pressure 18 19         Pupils       Pupils Dark Light Shape React APD   Right PERRL 3 2 Round Brisk None   Left PERRL 3 2 Round Brisk None         Visual Fields       Left Right    Full Full         Extraocular Movement       Right Left    Full, Ortho Full, Ortho         Neuro/Psych     Oriented x3: Yes   Mood/Affect: Normal         Dilation     Both eyes: 1.0% Mydriacyl, 2.5% Phenylephrine  @ 1:47 PM           Slit Lamp and Fundus Exam     Slit Lamp Exam       Right Left   Lids/Lashes Normal Normal   Conjunctiva/Sclera mild melanosis mild melanosis   Cornea Clear Clear   Anterior Chamber deep, clear, narrow temporal angle deep, clear, narrow temporal angle   Iris Round and dilated Round and dilated   Lens Clear Clear   Anterior Vitreous mild syneresis, vitreous condensations inferiorly  mild syneresis         Fundus Exam       Right Left   Disc Pink and Sharp Pink and Sharp   C/D Ratio 0.2 0.2   Macula Flat, Good foveal reflex, No heme or edema Flat, Good foveal reflex, No heme or edema   Vessels Normal Normal   Periphery Attached, focal pigmented VR tuft at 0730 equator, mild peripheral cystoid degeneration, pigmented lattice from 1030-1100 -- good laser changes surrounding both lesions, punctate IRH at 0730 equator Attached, no heme or lattice, trace peripheral cystoid degeneration           IMAGING AND PROCEDURES  Imaging and Procedures for 08/11/2023  OCT, Retina - OU - Both Eyes        Right Eye Quality was good. Central Foveal Thickness: 265. Progression has been stable. Findings include normal foveal contour, no IRF, no SRF, vitreomacular adhesion .   Left Eye Quality was good. Central Foveal Thickness: 247. Progression has been stable. Findings include normal foveal contour, no IRF, no SRF, vitreomacular adhesion .   Notes  *Images captured and stored on drive  Diagnosis / Impression:  NFP, no IRF / SRF OU  Clinical management:  See below  Abbreviations: NFP - Normal foveal profile. CME - cystoid macular edema. PED - pigment epithelial detachment. IRF - intraretinal fluid. SRF - subretinal fluid. EZ - ellipsoid zone. ERM - epiretinal membrane. ORA - outer retinal atrophy. ORT - outer retinal tubulation. SRHM - subretinal hyper-reflective material. 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 Right retinal defect  H33.301 OCT, Retina - OU - Both Eyes     1,2. Lattice degeneration w/ retinal defect, right eye  - pt asymptomatic, BCVA 20/20 OU - pigmented lattice from 1030-1100; focal pigmented VR tuft at 0730 equator - s/p laser retinopexy OD 05.12.25 -- good laser changes in place - no new RT/RD - f/u 3-4  months -- DFE/OCT  Ophthalmic Meds Ordered this visit:  No orders of the defined types were placed in this  encounter.    Return for f/u 3-4 months, lattice degeneration s/p laser OD, DFE, OCT.  There are no Patient Instructions on file for this visit.  Explained the diagnoses, plan, and follow up with the patient and they expressed understanding.  Patient expressed understanding of the importance of proper follow up care.   This document serves as a record of services personally performed by Jeanice Millard, MD, PhD. It was created on their behalf by Eller Gut COT, an ophthalmic technician. The creation of this record is the provider's dictation and/or activities during the visit.    Electronically signed by: Eller Gut COT 05.29.25 4:21 PM  This document serves as a record of services personally performed by Jeanice Millard, MD, PhD. It was created on their behalf by Morley Arabia. Bevin Bucks, OA an ophthalmic technician. The creation of this record is the provider's dictation and/or activities during the visit.    Electronically signed by: Morley Arabia. Bevin Bucks, OA 08/11/23 4:21 PM  Jeanice Millard, M.D., Ph.D. Diseases & Surgery of the Retina and Vitreous [facility] [place] 08/11/2023  I have reviewed the above documentation for accuracy and completeness, and I agree with the above. Jeanice Millard, M.D., Ph.D. 08/11/23 4:22 PM   Abbreviations: M myopia (nearsighted); A astigmatism; H hyperopia (farsighted); P presbyopia; Mrx spectacle prescription;  CTL contact lenses; OD right eye; OS left eye; OU both eyes  XT exotropia; ET esotropia; PEK punctate epithelial keratitis; PEE punctate epithelial erosions; DES dry eye syndrome; MGD meibomian gland dysfunction; ATs artificial tears; PFAT's preservative free artificial tears; NSC nuclear sclerotic cataract; PSC posterior subcapsular cataract; ERM epi-retinal membrane; PVD posterior vitreous detachment; RD retinal  detachment; DM diabetes mellitus; DR diabetic retinopathy; NPDR non-proliferative diabetic retinopathy; PDR proliferative diabetic retinopathy; CSME clinically significant macular edema; DME diabetic macular edema; dbh dot blot hemorrhages; CWS cotton wool spot; POAG primary open angle glaucoma; C/D cup-to-disc ratio; HVF humphrey visual field; GVF goldmann visual field; OCT optical coherence tomography; IOP intraocular pressure; BRVO Branch retinal vein occlusion; CRVO central retinal vein occlusion; CRAO central retinal artery occlusion; BRAO branch retinal artery occlusion; RT retinal tear; SB scleral buckle; PPV pars plana vitrectomy; VH Vitreous hemorrhage; PRP panretinal laser photocoagulation; IVK intravitreal kenalog; VMT vitreomacular traction; MH Macular hole;  NVD neovascularization of the disc; NVE neovascularization elsewhere; AREDS age related eye disease study; ARMD age related macular degeneration; POAG primary open angle glaucoma; EBMD epithelial/anterior basement membrane dystrophy; ACIOL anterior chamber intraocular lens; IOL intraocular lens; PCIOL posterior chamber intraocular lens; Phaco/IOL phacoemulsification with intraocular lens placement; PRK photorefractive keratectomy; LASIK laser assisted in situ keratomileusis; HTN hypertension; DM diabetes mellitus; COPD chronic obstructive pulmonary disease

## 2023-08-11 ENCOUNTER — Ambulatory Visit (INDEPENDENT_AMBULATORY_CARE_PROVIDER_SITE_OTHER): Admitting: Ophthalmology

## 2023-08-11 ENCOUNTER — Encounter (INDEPENDENT_AMBULATORY_CARE_PROVIDER_SITE_OTHER): Payer: Self-pay | Admitting: Ophthalmology

## 2023-08-11 DIAGNOSIS — H33301 Unspecified retinal break, right eye: Secondary | ICD-10-CM | POA: Diagnosis not present

## 2023-08-11 DIAGNOSIS — H35411 Lattice degeneration of retina, right eye: Secondary | ICD-10-CM

## 2023-09-18 ENCOUNTER — Other Ambulatory Visit (INDEPENDENT_AMBULATORY_CARE_PROVIDER_SITE_OTHER): Payer: Self-pay

## 2023-09-18 ENCOUNTER — Encounter: Payer: Self-pay | Admitting: Orthopaedic Surgery

## 2023-09-18 ENCOUNTER — Other Ambulatory Visit: Payer: Self-pay

## 2023-09-18 ENCOUNTER — Ambulatory Visit (INDEPENDENT_AMBULATORY_CARE_PROVIDER_SITE_OTHER): Admitting: Orthopaedic Surgery

## 2023-09-18 DIAGNOSIS — M25561 Pain in right knee: Secondary | ICD-10-CM

## 2023-09-18 DIAGNOSIS — M25562 Pain in left knee: Secondary | ICD-10-CM | POA: Diagnosis not present

## 2023-09-18 DIAGNOSIS — G8929 Other chronic pain: Secondary | ICD-10-CM

## 2023-09-18 NOTE — Progress Notes (Signed)
 The patient is a very pleasant 33 year old female that I am seeing for the first time.  She has been really getting back into exercising walking and working on weight loss.  She has been on weight loss journey.  She is considered morbidly obese since she is only 5 feet tall and her highest weight she was 219 pounds but she says she is down to about 208 now.  She says her knees hurt her quite a bit when she is trying to exercise and this is affecting her foot and ankle on both sides.  She denies any injuries to her knees.  Has never had surgery on her knees and never had any physical therapy or injections in her knees.  On exam both knees have slight valgus malalignment when she sits but when she stands it seems to be more neutral when she is standing still.  Both knees do have patellofemoral crepitation but no effusion.  Both knees have good range of motion and are ligamentously stable.  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  The medial lateral compartments are well-maintained with both knees.  She would definitely benefit from continued weight loss but also formal physical therapy to work on any modalities that can strengthen the muscles around her knees and I think this would help her quite a bit.  She also agrees with the treatment plan.  I did talk about holistic type of medication such as turmeric and glucosamine which could help.  We can always try steroid injections down the road if needed with her knees.  She is interested in being set up for physical therapy so I think that is reasonable and a good treatment plan for now.  Will then see her back in 6 weeks after course of therapy.  At that visit I would like a weight and BMI calculation.

## 2023-10-23 NOTE — Progress Notes (Shared)
 [facility] - Clinic Note  11/06/2023   CHIEF COMPLAINT Patient presents for No chief complaint on file.  HISTORY OF PRESENT ILLNESS: Hannah Morton is a 33 y.o. female who presents to the clinic today for:   Pt states she had no problems after the laser procedure, she had a floater for a few days afterwards and used PF for a week as directed  Referring physician: Octavia Charlie Hamilton, MD [address] [place],  [place] [address]  HISTORICAL INFORMATION:  Selected notes from the MEDICAL RECORD NUMBER Referred by Dr. Octavia for concern of lattice / retinal hole LEE:  Ocular Hx- PMH-   CURRENT MEDICATIONS: No current outpatient medications on file. (Ophthalmic Drugs)   No current facility-administered medications for this visit. (Ophthalmic Drugs)   No current outpatient medications on file. (Other)   No current facility-administered medications for this visit. (Other)   REVIEW OF SYSTEMS:    ALLERGIES Allergies  Allergen Reactions   Codeine Rash   PAST MEDICAL HISTORY Past Medical History:  Diagnosis Date   Frequent headaches    Migraines    Obesity (BMI 35.0-39.9 without comorbidity)    Past Surgical History:  Procedure Laterality Date   CHOLECYSTECTOMY, LAPAROSCOPIC     WISDOM TOOTH EXTRACTION     FAMILY HISTORY Family History  Problem Relation Age of Onset   Alcohol abuse Mother    COPD Mother    Hypertension Mother    Diabetes Father    Alcohol abuse Brother    Hypertension Brother    Arthritis Maternal Grandmother    Asthma Maternal Grandmother    Birth defects Maternal Grandmother    Cancer Maternal Grandmother    COPD Maternal Grandmother    Diabetes Maternal Grandmother    Heart disease Maternal Grandmother    Hyperlipidemia Maternal Grandmother    Stroke Maternal Grandfather    Stroke Paternal Grandfather    Heart attack Paternal Grandfather    SOCIAL HISTORY Social History   Tobacco Use   Smoking status:  Never   Smokeless tobacco: Never  Vaping Use   Vaping status: Never Used  Substance Use Topics   Alcohol use: Yes    Alcohol/week: 4.0 standard drinks of alcohol    Types: 2 Shots of liquor, 2 Standard drinks or equivalent per week    Comment: socially   Drug use: No       OPHTHALMIC EXAM:  Not recorded    IMAGING AND PROCEDURES  Imaging and Procedures for 11/06/2023         ASSESSMENT/PLAN:   ICD-10-CM   1. Lattice degeneration of right retina  H35.411     2. Right retinal defect  H33.301       1,2. Lattice degeneration w/ retinal defect, right eye  - pt asymptomatic, BCVA 20/20 OU - pigmented lattice from 1030-1100; focal pigmented VR tuft at 0730 equator - s/p laser retinopexy OD 05.12.25 -- good laser changes in place - no new RT/RD - f/u 3-4 months -- DFE/OCT  Ophthalmic Meds Ordered this visit:  No orders of the defined types were placed in this encounter.    No follow-ups on file.  There are no Patient Instructions on file for this visit.  Explained the diagnoses, plan, and follow up with the patient and they expressed understanding.  Patient expressed understanding of the importance of proper follow up care.   This document serves as a record of services personally performed by Redell JUDITHANN Hans,  MD, PhD. It was created on their behalf by Avelina Pereyra, COA an ophthalmic technician. The creation of this record is the provider's dictation and/or activities during the visit.   Electronically signed by: Avelina GORMAN Pereyra, COT  10/23/23  2:54 PM    Redell JUDITHANN Hans, M.D., Ph.D. Diseases & Surgery of the Retina and Vitreous [facility] 11/06/2023   Abbreviations: M myopia (nearsighted); A astigmatism; H hyperopia (farsighted); P presbyopia; Mrx spectacle prescription;  CTL contact lenses; OD right eye; OS left eye; OU both eyes  XT exotropia; ET esotropia; PEK punctate epithelial keratitis; PEE punctate epithelial erosions; DES dry eye  syndrome; MGD meibomian gland dysfunction; ATs artificial tears; PFAT's preservative free artificial tears; NSC nuclear sclerotic cataract; PSC posterior subcapsular cataract; ERM epi-retinal membrane; PVD posterior vitreous detachment; RD retinal detachment; DM diabetes mellitus; DR diabetic retinopathy; NPDR non-proliferative diabetic retinopathy; PDR proliferative diabetic retinopathy; CSME clinically significant macular edema; DME diabetic macular edema; dbh dot blot hemorrhages; CWS cotton wool spot; POAG primary open angle glaucoma; C/D cup-to-disc ratio; HVF humphrey visual field; GVF goldmann visual field; OCT optical coherence tomography; IOP intraocular pressure; BRVO Branch retinal vein occlusion; CRVO central retinal vein occlusion; CRAO central retinal artery occlusion; BRAO branch retinal artery occlusion; RT retinal tear; SB scleral buckle; PPV pars plana vitrectomy; VH Vitreous hemorrhage; PRP panretinal laser photocoagulation; IVK intravitreal kenalog; VMT vitreomacular traction; MH Macular hole;  NVD neovascularization of the disc; NVE neovascularization elsewhere; AREDS age related eye disease study; ARMD age related macular degeneration; POAG primary open angle glaucoma; EBMD epithelial/anterior basement membrane dystrophy; ACIOL anterior chamber intraocular lens; IOL intraocular lens; PCIOL posterior chamber intraocular lens; Phaco/IOL phacoemulsification with intraocular lens placement; PRK photorefractive keratectomy; LASIK laser assisted in situ keratomileusis; HTN hypertension; DM diabetes mellitus; COPD chronic obstructive pulmonary disease

## 2023-11-02 ENCOUNTER — Ambulatory Visit: Admitting: Orthopaedic Surgery

## 2023-11-06 ENCOUNTER — Encounter (INDEPENDENT_AMBULATORY_CARE_PROVIDER_SITE_OTHER): Payer: Self-pay

## 2023-11-06 ENCOUNTER — Encounter (INDEPENDENT_AMBULATORY_CARE_PROVIDER_SITE_OTHER): Admitting: Ophthalmology

## 2023-11-06 DIAGNOSIS — H35411 Lattice degeneration of retina, right eye: Secondary | ICD-10-CM

## 2023-11-06 DIAGNOSIS — H33301 Unspecified retinal break, right eye: Secondary | ICD-10-CM

## 2023-11-28 NOTE — Progress Notes (Signed)
 [facility] - Clinic Note  12/11/2023   CHIEF COMPLAINT Patient presents for Retina Follow Up  HISTORY OF PRESENT ILLNESS: Hannah Morton is a 33 y.o. female who presents to the clinic today for:  HPI     Retina Follow Up   Patient presents with  Other.  In right eye.  This started 4 months ago.  I, the attending physician,  performed the HPI with the patient and updated documentation appropriately.        Comments   Patient here for 4 months retina follow up for lattice degeneration OD. Patient states vision doing good. No eye pain.       Last edited by Valdemar Rogue, MD on 12/11/2023  9:01 PM.    Pt states she's doing well, no vision concerns or medical issues.   Referring physician: Octavia Charlie Hamilton, MD [address] [place],  [place] [address]  HISTORICAL INFORMATION:  Selected notes from the MEDICAL RECORD NUMBER Referred by Dr. Octavia for concern of lattice / retinal hole LEE:  Ocular Hx- PMH-   CURRENT MEDICATIONS: No current outpatient medications on file. (Ophthalmic Drugs)   No current facility-administered medications for this visit. (Ophthalmic Drugs)   No current outpatient medications on file. (Other)   No current facility-administered medications for this visit. (Other)   REVIEW OF SYSTEMS: ROS   Positive for: Eyes Last edited by Orval Asberry RAMAN, COA on 12/11/2023  2:26 PM.     ALLERGIES Allergies  Allergen Reactions   Codeine Rash   PAST MEDICAL HISTORY Past Medical History:  Diagnosis Date   Frequent headaches    Migraines    Obesity (BMI 35.0-39.9 without comorbidity)    Past Surgical History:  Procedure Laterality Date   CHOLECYSTECTOMY, LAPAROSCOPIC     WISDOM TOOTH EXTRACTION     FAMILY HISTORY Family History  Problem Relation Age of Onset   Alcohol abuse Mother    COPD Mother    Hypertension Mother    Diabetes Father    Alcohol abuse Brother    Hypertension Brother    Arthritis Maternal  Grandmother    Asthma Maternal Grandmother    Birth defects Maternal Grandmother    Cancer Maternal Grandmother    COPD Maternal Grandmother    Diabetes Maternal Grandmother    Heart disease Maternal Grandmother    Hyperlipidemia Maternal Grandmother    Stroke Maternal Grandfather    Stroke Paternal Grandfather    Heart attack Paternal Grandfather    SOCIAL HISTORY Social History   Tobacco Use   Smoking status: Never   Smokeless tobacco: Never  Vaping Use   Vaping status: Never Used  Substance Use Topics   Alcohol use: Yes    Alcohol/week: 4.0 standard drinks of alcohol    Types: 2 Shots of liquor, 2 Standard drinks or equivalent per week    Comment: socially   Drug use: No       OPHTHALMIC EXAM:  Base Eye Exam     Visual Acuity (Snellen - Linear)       Right Left   Dist [place] 20/20 20/20         Tonometry (Tonopen, 2:24 PM)       Right Left   Pressure 22 21         Pupils       Dark Light Shape React APD   Right 3 2 Round Brisk None   Left 3 2 Round Brisk None  Visual Fields (Counting fingers)       Left Right    Full Full         Extraocular Movement       Right Left    Full, Ortho Full, Ortho         Neuro/Psych     Oriented x3: Yes   Mood/Affect: Normal         Dilation     Both eyes: 1.0% Mydriacyl, 2.5% Phenylephrine  @ 2:24 PM           Slit Lamp and Fundus Exam     Slit Lamp Exam       Right Left   Lids/Lashes Normal Normal   Conjunctiva/Sclera mild melanosis mild melanosis   Cornea Clear Clear   Anterior Chamber deep, clear, narrow temporal angle deep, clear, narrow temporal angle   Iris Round and dilated Round and dilated   Lens Clear Clear   Anterior Vitreous mild syneresis, vitreous condensations inferiorly mild syneresis         Fundus Exam       Right Left   Disc Pink and Sharp, Compact Pink and Sharp   C/D Ratio 0.2 0.2   Macula Flat, Good foveal reflex, No heme or edema Flat, Good foveal  reflex, No heme or edema   Vessels Normal Normal   Periphery Attached, focal pigmented VR tuft at 0730 equator, pigmented lattice from 1030-1100 -- good laser changes surrounding both lesions, mild peripheral cystoid degeneration, shallow schisis cavity ST periphery, no new RT/RD Attached, no heme or lattice, shallow schisis cavity ST periphery, trace peripheral cystoid degeneration           IMAGING AND PROCEDURES  Imaging and Procedures for 12/11/2023  OCT, Retina - OU - Both Eyes       Right Eye Quality was good. Central Foveal Thickness: 259. Progression has been stable. Findings include normal foveal contour, no IRF, no SRF, vitreomacular adhesion .   Left Eye Quality was good. Central Foveal Thickness: 243. Progression has been stable. Findings include normal foveal contour, no IRF, no SRF, vitreomacular adhesion .   Notes *Images captured and stored on drive  Diagnosis / Impression:  NFP, no IRF / SRF OU  Clinical management:  See below  Abbreviations: NFP - Normal foveal profile. CME - cystoid macular edema. PED - pigment epithelial detachment. IRF - intraretinal fluid. SRF - subretinal fluid. EZ - ellipsoid zone. ERM - epiretinal membrane. ORA - outer retinal atrophy. ORT - outer retinal tubulation. SRHM - subretinal hyper-reflective material. 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 Right retinal defect  H33.301       1,2. Lattice degeneration w/ retinal defect, right eye  - pt asymptomatic, BCVA 20/20 OU - pigmented lattice from 1030-1100; focal pigmented VR tuft at 0730 equator - s/p laser retinopexy OD 05.12.25 -- good laser changes in place - no new RT/RD - pt is cleared from a retina standpoint for release to Dr. Octavia and resumption of primary eye care  - f/u here PRN  Ophthalmic Meds Ordered this visit:  No orders of  the defined types were placed in this encounter.    Return if symptoms worsen or fail to improve.  There are no Patient Instructions on file for this visit.  Explained the diagnoses, plan,  and follow up with the patient and they expressed understanding.  Patient expressed understanding of the importance of proper follow up care.   This document serves as a record of services personally performed by Redell JUDITHANN Hans, MD, PhD. It was created on their behalf by Avelina Pereyra, COA an ophthalmic technician. The creation of this record is the provider's dictation and/or activities during the visit.   Electronically signed by: Avelina GORMAN Pereyra, COT  12/11/23  9:02 PM   Redell JUDITHANN Hans, M.D., Ph.D. Diseases & Surgery of the Retina and Vitreous [facility] 12/11/2023  I have reviewed the above documentation for accuracy and completeness, and I agree with the above. Redell JUDITHANN Hans, M.D., Ph.D. 12/11/23 9:03 PM   Abbreviations: M myopia (nearsighted); A astigmatism; H hyperopia (farsighted); P presbyopia; Mrx spectacle prescription;  CTL contact lenses; OD right eye; OS left eye; OU both eyes  XT exotropia; ET esotropia; PEK punctate epithelial keratitis; PEE punctate epithelial erosions; DES dry eye syndrome; MGD meibomian gland dysfunction; ATs artificial tears; PFAT's preservative free artificial tears; NSC nuclear sclerotic cataract; PSC posterior subcapsular cataract; ERM epi-retinal membrane; PVD posterior vitreous detachment; RD retinal detachment; DM diabetes mellitus; DR diabetic retinopathy; NPDR non-proliferative diabetic retinopathy; PDR proliferative diabetic retinopathy; CSME clinically significant macular edema; DME diabetic macular edema; dbh dot blot hemorrhages; CWS cotton wool spot; POAG primary open angle glaucoma; C/D cup-to-disc ratio; HVF humphrey visual field; GVF goldmann visual field; OCT optical coherence tomography; IOP intraocular pressure; BRVO Branch  retinal vein occlusion; CRVO central retinal vein occlusion; CRAO central retinal artery occlusion; BRAO branch retinal artery occlusion; RT retinal tear; SB scleral buckle; PPV pars plana vitrectomy; VH Vitreous hemorrhage; PRP panretinal laser photocoagulation; IVK intravitreal kenalog; VMT vitreomacular traction; MH Macular hole;  NVD neovascularization of the disc; NVE neovascularization elsewhere; AREDS age related eye disease study; ARMD age related macular degeneration; POAG primary open angle glaucoma; EBMD epithelial/anterior basement membrane dystrophy; ACIOL anterior chamber intraocular lens; IOL intraocular lens; PCIOL posterior chamber intraocular lens; Phaco/IOL phacoemulsification with intraocular lens placement; PRK photorefractive keratectomy; LASIK laser assisted in situ keratomileusis; HTN hypertension; DM diabetes mellitus; COPD chronic obstructive pulmonary disease

## 2023-12-11 ENCOUNTER — Ambulatory Visit (INDEPENDENT_AMBULATORY_CARE_PROVIDER_SITE_OTHER): Admitting: Ophthalmology

## 2023-12-11 ENCOUNTER — Encounter (INDEPENDENT_AMBULATORY_CARE_PROVIDER_SITE_OTHER): Payer: Self-pay | Admitting: Ophthalmology

## 2023-12-11 DIAGNOSIS — H33301 Unspecified retinal break, right eye: Secondary | ICD-10-CM

## 2023-12-11 DIAGNOSIS — H35411 Lattice degeneration of retina, right eye: Secondary | ICD-10-CM

## 2024-01-09 ENCOUNTER — Other Ambulatory Visit: Payer: Self-pay | Admitting: Medical Genetics

## 2024-01-09 DIAGNOSIS — Z006 Encounter for examination for normal comparison and control in clinical research program: Secondary | ICD-10-CM

## 2024-01-15 ENCOUNTER — Encounter: Payer: Self-pay | Admitting: Radiology
